# Patient Record
Sex: Male | Born: 1950 | Race: Black or African American | Hispanic: No | Marital: Single | Smoking: Former smoker
Health system: Southern US, Community
[De-identification: ages and names within clinical notes are randomized; demographics above are authoritative.]

## PROBLEM LIST (undated history)

## (undated) DIAGNOSIS — I509 Heart failure, unspecified: Secondary | ICD-10-CM

## (undated) DIAGNOSIS — M109 Gout, unspecified: Secondary | ICD-10-CM

## (undated) DIAGNOSIS — I10 Essential (primary) hypertension: Secondary | ICD-10-CM

## (undated) DIAGNOSIS — I499 Cardiac arrhythmia, unspecified: Secondary | ICD-10-CM

## (undated) DIAGNOSIS — K5792 Diverticulitis of intestine, part unspecified, without perforation or abscess without bleeding: Secondary | ICD-10-CM

## (undated) DIAGNOSIS — J449 Chronic obstructive pulmonary disease, unspecified: Secondary | ICD-10-CM

## (undated) HISTORY — DX: Essential (primary) hypertension: I10

## (undated) HISTORY — PX: CARDIAC SURGERY: SHX584

## (undated) HISTORY — PX: CARDIAC CATHETERIZATION: SHX172

## (undated) HISTORY — DX: Cardiac arrhythmia, unspecified: I49.9

---

## 2003-05-16 ENCOUNTER — Other Ambulatory Visit: Payer: Self-pay

## 2003-06-16 ENCOUNTER — Other Ambulatory Visit: Payer: Self-pay

## 2003-07-28 ENCOUNTER — Other Ambulatory Visit: Payer: Self-pay

## 2003-11-27 ENCOUNTER — Other Ambulatory Visit: Payer: Self-pay

## 2004-07-11 ENCOUNTER — Emergency Department: Payer: Self-pay | Admitting: Emergency Medicine

## 2004-07-24 ENCOUNTER — Observation Stay: Payer: Self-pay | Admitting: Internal Medicine

## 2004-08-10 ENCOUNTER — Inpatient Hospital Stay: Payer: Self-pay | Admitting: Internal Medicine

## 2005-01-10 ENCOUNTER — Emergency Department: Payer: Self-pay | Admitting: Emergency Medicine

## 2005-01-10 ENCOUNTER — Other Ambulatory Visit: Payer: Self-pay

## 2005-02-08 ENCOUNTER — Emergency Department: Payer: Self-pay | Admitting: Emergency Medicine

## 2005-04-02 ENCOUNTER — Other Ambulatory Visit: Payer: Self-pay

## 2005-04-02 ENCOUNTER — Emergency Department: Payer: Self-pay | Admitting: Emergency Medicine

## 2005-04-18 ENCOUNTER — Other Ambulatory Visit: Payer: Self-pay

## 2005-04-18 ENCOUNTER — Inpatient Hospital Stay: Payer: Self-pay

## 2005-05-21 ENCOUNTER — Other Ambulatory Visit: Payer: Self-pay

## 2005-05-21 ENCOUNTER — Inpatient Hospital Stay: Payer: Self-pay | Admitting: Internal Medicine

## 2005-06-04 ENCOUNTER — Other Ambulatory Visit: Payer: Self-pay

## 2005-06-04 ENCOUNTER — Emergency Department: Payer: Self-pay | Admitting: Unknown Physician Specialty

## 2005-06-12 ENCOUNTER — Other Ambulatory Visit: Payer: Self-pay

## 2005-06-12 ENCOUNTER — Inpatient Hospital Stay: Payer: Self-pay

## 2005-10-24 ENCOUNTER — Emergency Department: Payer: Self-pay | Admitting: Emergency Medicine

## 2005-10-24 ENCOUNTER — Other Ambulatory Visit: Payer: Self-pay

## 2006-12-17 ENCOUNTER — Emergency Department: Payer: Self-pay | Admitting: Emergency Medicine

## 2011-03-20 ENCOUNTER — Inpatient Hospital Stay: Payer: Self-pay | Admitting: Internal Medicine

## 2012-08-04 ENCOUNTER — Emergency Department: Payer: Self-pay | Admitting: Unknown Physician Specialty

## 2012-08-31 ENCOUNTER — Emergency Department: Payer: Self-pay | Admitting: Emergency Medicine

## 2012-09-24 ENCOUNTER — Emergency Department: Payer: Self-pay | Admitting: Emergency Medicine

## 2013-09-06 ENCOUNTER — Encounter: Payer: Self-pay | Admitting: Podiatry

## 2013-09-06 ENCOUNTER — Ambulatory Visit (INDEPENDENT_AMBULATORY_CARE_PROVIDER_SITE_OTHER): Payer: Medicare Other | Admitting: Podiatry

## 2013-09-06 VITALS — BP 126/86 | HR 78 | Resp 22 | Ht 71.0 in | Wt 205.0 lb

## 2013-09-06 DIAGNOSIS — M79609 Pain in unspecified limb: Secondary | ICD-10-CM

## 2013-09-06 DIAGNOSIS — B351 Tinea unguium: Secondary | ICD-10-CM

## 2013-09-06 NOTE — Progress Notes (Signed)
Subjective:     Patient ID: George Hopkins, male   DOB: Oct 27, 1950, 63 y.o.   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

## 2013-12-06 ENCOUNTER — Ambulatory Visit: Payer: Medicare Other | Admitting: Podiatry

## 2013-12-16 ENCOUNTER — Ambulatory Visit (INDEPENDENT_AMBULATORY_CARE_PROVIDER_SITE_OTHER): Payer: Medicare Other | Admitting: Podiatry

## 2013-12-16 DIAGNOSIS — B351 Tinea unguium: Secondary | ICD-10-CM

## 2013-12-16 DIAGNOSIS — M79676 Pain in unspecified toe(s): Secondary | ICD-10-CM

## 2013-12-16 DIAGNOSIS — M79609 Pain in unspecified limb: Secondary | ICD-10-CM

## 2013-12-16 NOTE — Progress Notes (Signed)
Subjective:     Patient ID: George Hopkins, male   DOB: 1951-01-28, 63 y.o.   MRN: [id number]  HPI patient presents with thick nailbeds 1-5 both feet that can become painful   Review of Systems     Objective:   Physical Exam Neurovascular status intact with thick yellow brittle nailbeds 1-5 both feet    Assessment:     Mycotic nail infection is with pain 1-5 both feet    Plan:     Debride painful nailbeds 1-5 both feet with no iatrogenic bleeding noted

## 2014-03-10 ENCOUNTER — Other Ambulatory Visit: Payer: Medicare Other

## 2014-03-20 ENCOUNTER — Emergency Department: Payer: Self-pay | Admitting: Emergency Medicine

## 2014-03-24 ENCOUNTER — Ambulatory Visit (INDEPENDENT_AMBULATORY_CARE_PROVIDER_SITE_OTHER): Payer: Medicare Other | Admitting: Podiatry

## 2014-03-24 DIAGNOSIS — M79676 Pain in unspecified toe(s): Secondary | ICD-10-CM

## 2014-03-24 DIAGNOSIS — M779 Enthesopathy, unspecified: Secondary | ICD-10-CM

## 2014-03-24 DIAGNOSIS — B351 Tinea unguium: Secondary | ICD-10-CM

## 2014-03-24 MED ORDER — TRIAMCINOLONE ACETONIDE 10 MG/ML IJ SUSP
10.0000 mg | Freq: Once | INTRAMUSCULAR | Status: AC
  Administered 2014-03-24: 10 mg

## 2014-03-24 NOTE — Progress Notes (Signed)
Subjective:     Patient ID: York RamFrancis Olesky, male   DOB: 03-31-1951, 63 y.o.   MRN: [id number]  HPI patient presents with thick nailbeds 1-5 both feet that can become painful. Also complains of a lot of pain on top of her left foot that's making it hard for her to ambulate   Review of Systems     Objective:   Physical Exam Neurovascular status intact with thick yellow brittle nailbeds 1-5 both feet. Noted to have inflammation and tendon irritation dorsum left foot upon palpation along with inflammatory capsulitis of the third metatarsal phalangeal joint    Assessment:     Mycotic nail infection is with pain 1-5 both feet. Tendinitis capsulitis like dorsal condition    Plan:     Debride painful nailbeds 1-5 both feet with no iatrogenic bleeding noted. Injected the dorsal capsule and tendon complex 3 mg Kenalog 5 mg Xylocaine and reappoint as needed for this particular problem

## 2014-05-17 ENCOUNTER — Inpatient Hospital Stay: Payer: Self-pay | Admitting: Internal Medicine

## 2014-05-17 LAB — CBC WITH DIFFERENTIAL/PLATELET
BASOS PCT: 0.4 %
Basophil #: 0 10*3/uL (ref 0.0–0.1)
EOS PCT: 1.2 %
Eosinophil #: 0.1 10*3/uL (ref 0.0–0.7)
HCT: 45.1 % (ref 40.0–52.0)
HGB: 14.7 g/dL (ref 13.0–18.0)
LYMPHS ABS: 1.9 10*3/uL (ref 1.0–3.6)
Lymphocyte %: 18.9 %
MCH: 30.6 pg (ref 26.0–34.0)
MCHC: 32.5 g/dL (ref 32.0–36.0)
MCV: 94 fL (ref 80–100)
MONO ABS: 0.9 x10 3/mm (ref 0.2–1.0)
Monocyte %: 8.9 %
Neutrophil #: 7.2 10*3/uL — ABNORMAL HIGH (ref 1.4–6.5)
Neutrophil %: 70.6 %
Platelet: 306 10*3/uL (ref 150–440)
RBC: 4.79 10*6/uL (ref 4.40–5.90)
RDW: 14.6 % — ABNORMAL HIGH (ref 11.5–14.5)
WBC: 10.2 10*3/uL (ref 3.8–10.6)

## 2014-05-17 LAB — URINALYSIS, COMPLETE
BILIRUBIN, UR: NEGATIVE
BLOOD: NEGATIVE
Glucose,UR: NEGATIVE mg/dL (ref 0–75)
Ketone: NEGATIVE
Nitrite: NEGATIVE
Ph: 7 (ref 4.5–8.0)
Protein: NEGATIVE
Specific Gravity: 1.006 (ref 1.003–1.030)
WBC UR: 27 /HPF (ref 0–5)

## 2014-05-17 LAB — COMPREHENSIVE METABOLIC PANEL
ALBUMIN: 3.2 g/dL — AB (ref 3.4–5.0)
ANION GAP: 12 (ref 7–16)
AST: 23 U/L (ref 15–37)
Alkaline Phosphatase: 61 U/L (ref 46–116)
BUN: 22 mg/dL — ABNORMAL HIGH (ref 7–18)
Bilirubin,Total: 0.6 mg/dL (ref 0.2–1.0)
Calcium, Total: 8.5 mg/dL (ref 8.5–10.1)
Chloride: 106 mmol/L (ref 98–107)
Co2: 27 mmol/L (ref 21–32)
Creatinine: 1.58 mg/dL — ABNORMAL HIGH (ref 0.60–1.30)
EGFR (African American): 57 — ABNORMAL LOW
EGFR (Non-African Amer.): 47 — ABNORMAL LOW
GLUCOSE: 117 mg/dL — AB (ref 65–99)
OSMOLALITY: 293 (ref 275–301)
POTASSIUM: 3 mmol/L — AB (ref 3.5–5.1)
SGPT (ALT): 18 U/L (ref 14–63)
SODIUM: 145 mmol/L (ref 136–145)
TOTAL PROTEIN: 7 g/dL (ref 6.4–8.2)

## 2014-05-17 LAB — MAGNESIUM: MAGNESIUM: 1.9 mg/dL

## 2014-05-17 LAB — TROPONIN I
TROPONIN-I: 0.04 ng/mL
Troponin-I: 0.05 ng/mL
Troponin-I: 0.06 ng/mL — ABNORMAL HIGH

## 2014-05-17 LAB — CK TOTAL AND CKMB (NOT AT ARMC)
CK, TOTAL: 103 U/L (ref 39–308)
CK-MB: 2.3 ng/mL (ref 0.5–3.6)

## 2014-05-17 LAB — PRO B NATRIURETIC PEPTIDE: B-TYPE NATIURETIC PEPTID: 3482 pg/mL — AB (ref 0–125)

## 2014-05-17 LAB — PROTIME-INR
INR: 1.1
PROTHROMBIN TIME: 14 s (ref 11.5–14.7)

## 2014-05-18 LAB — CBC WITH DIFFERENTIAL/PLATELET
BASOS ABS: 0.1 10*3/uL (ref 0.0–0.1)
BASOS PCT: 0.8 %
EOS ABS: 0.2 10*3/uL (ref 0.0–0.7)
EOS PCT: 2.2 %
HCT: 46 % (ref 40.0–52.0)
HGB: 15 g/dL (ref 13.0–18.0)
LYMPHS PCT: 25.2 %
Lymphocyte #: 1.8 10*3/uL (ref 1.0–3.6)
MCH: 31 pg (ref 26.0–34.0)
MCHC: 32.6 g/dL (ref 32.0–36.0)
MCV: 95 fL (ref 80–100)
Monocyte #: 0.9 x10 3/mm (ref 0.2–1.0)
Monocyte %: 12.1 %
NEUTROS PCT: 59.7 %
Neutrophil #: 4.3 10*3/uL (ref 1.4–6.5)
PLATELETS: 298 10*3/uL (ref 150–440)
RBC: 4.84 10*6/uL (ref 4.40–5.90)
RDW: 15.1 % — ABNORMAL HIGH (ref 11.5–14.5)
WBC: 7.3 10*3/uL (ref 3.8–10.6)

## 2014-05-18 LAB — BASIC METABOLIC PANEL
Anion Gap: 6 — ABNORMAL LOW (ref 7–16)
BUN: 26 mg/dL — ABNORMAL HIGH (ref 7–18)
CO2: 31 mmol/L (ref 21–32)
Calcium, Total: 8.4 mg/dL — ABNORMAL LOW (ref 8.5–10.1)
Chloride: 107 mmol/L (ref 98–107)
Creatinine: 1.69 mg/dL — ABNORMAL HIGH (ref 0.60–1.30)
EGFR (African American): 53 — ABNORMAL LOW
EGFR (Non-African Amer.): 43 — ABNORMAL LOW
Glucose: 104 mg/dL — ABNORMAL HIGH (ref 65–99)
OSMOLALITY: 292 (ref 275–301)
POTASSIUM: 3.7 mmol/L (ref 3.5–5.1)
SODIUM: 144 mmol/L (ref 136–145)

## 2014-05-18 LAB — LIPID PANEL
Cholesterol: 161 mg/dL (ref 0–200)
HDL: 40 mg/dL (ref 40–60)
Ldl Cholesterol, Calc: 99 mg/dL (ref 0–100)
TRIGLYCERIDES: 112 mg/dL (ref 0–200)
VLDL CHOLESTEROL, CALC: 22 mg/dL (ref 5–40)

## 2014-05-19 LAB — BASIC METABOLIC PANEL
Anion Gap: 5 — ABNORMAL LOW (ref 7–16)
BUN: 29 mg/dL — ABNORMAL HIGH (ref 7–18)
CALCIUM: 8.5 mg/dL (ref 8.5–10.1)
Chloride: 108 mmol/L — ABNORMAL HIGH (ref 98–107)
Co2: 28 mmol/L (ref 21–32)
Creatinine: 1.69 mg/dL — ABNORMAL HIGH (ref 0.60–1.30)
EGFR (African American): 53 — ABNORMAL LOW
EGFR (Non-African Amer.): 43 — ABNORMAL LOW
Glucose: 108 mg/dL — ABNORMAL HIGH (ref 65–99)
OSMOLALITY: 288 (ref 275–301)
Potassium: 3.9 mmol/L (ref 3.5–5.1)
Sodium: 141 mmol/L (ref 136–145)

## 2014-05-19 LAB — URINE CULTURE

## 2014-06-09 ENCOUNTER — Emergency Department: Payer: Self-pay | Admitting: Emergency Medicine

## 2014-06-23 ENCOUNTER — Other Ambulatory Visit: Payer: Medicare Other

## 2014-06-29 ENCOUNTER — Ambulatory Visit: Payer: Medicare Other | Admitting: Podiatry

## 2014-07-31 ENCOUNTER — Inpatient Hospital Stay
Admit: 2014-07-31 | Disposition: A | Discharge status: Home / Self Care | Payer: Self-pay | Attending: Internal Medicine | Admitting: Internal Medicine

## 2014-07-31 LAB — CBC
HCT: 44.2 % (ref 40.0–52.0)
HGB: 13.9 g/dL (ref 13.0–18.0)
MCH: 30 pg (ref 26.0–34.0)
MCHC: 31.3 g/dL — AB (ref 32.0–36.0)
MCV: 96 fL (ref 80–100)
PLATELETS: 215 10*3/uL (ref 150–440)
RBC: 4.62 10*6/uL (ref 4.40–5.90)
RDW: 15.8 % — AB (ref 11.5–14.5)
WBC: 7.4 10*3/uL (ref 3.8–10.6)

## 2014-07-31 LAB — COMPREHENSIVE METABOLIC PANEL
AST: 23 U/L
Albumin: 3.5 g/dL
Alkaline Phosphatase: 61 U/L
Anion Gap: 9 (ref 7–16)
BUN: 37 mg/dL — ABNORMAL HIGH
Bilirubin,Total: 0.9 mg/dL
CALCIUM: 8.7 mg/dL — AB
CHLORIDE: 104 mmol/L
Co2: 29 mmol/L
Creatinine: 1.92 mg/dL — ABNORMAL HIGH
GFR CALC AF AMER: 41 — AB
GFR CALC NON AF AMER: 35 — AB
GLUCOSE: 99 mg/dL
POTASSIUM: 3.5 mmol/L
SGPT (ALT): 22 U/L
Sodium: 142 mmol/L
Total Protein: 6.7 g/dL

## 2014-07-31 LAB — PROTIME-INR
INR: 1.1
Prothrombin Time: 14.5 secs

## 2014-07-31 LAB — CK TOTAL AND CKMB (NOT AT ARMC)
CK, TOTAL: 103 U/L
CK-MB: 5.3 ng/mL — ABNORMAL HIGH

## 2014-07-31 LAB — PRO B NATRIURETIC PEPTIDE: B-Type Natriuretic Peptide: 527 pg/mL — ABNORMAL HIGH

## 2014-07-31 LAB — TROPONIN I: Troponin-I: 0.16 ng/mL — ABNORMAL HIGH

## 2014-08-01 LAB — CBC WITH DIFFERENTIAL/PLATELET
Basophil #: 0.1 10*3/uL (ref 0.0–0.1)
Basophil %: 0.9 %
EOS PCT: 1.6 %
Eosinophil #: 0.1 10*3/uL (ref 0.0–0.7)
HCT: 44.5 % (ref 40.0–52.0)
HGB: 14 g/dL (ref 13.0–18.0)
LYMPHS PCT: 27 %
Lymphocyte #: 2.1 10*3/uL (ref 1.0–3.6)
MCH: 30.3 pg (ref 26.0–34.0)
MCHC: 31.5 g/dL — ABNORMAL LOW (ref 32.0–36.0)
MCV: 96 fL (ref 80–100)
MONOS PCT: 13.8 %
Monocyte #: 1.1 x10 3/mm — ABNORMAL HIGH (ref 0.2–1.0)
NEUTROS PCT: 56.7 %
Neutrophil #: 4.4 10*3/uL (ref 1.4–6.5)
PLATELETS: 224 10*3/uL (ref 150–440)
RBC: 4.62 10*6/uL (ref 4.40–5.90)
RDW: 16.1 % — ABNORMAL HIGH (ref 11.5–14.5)
WBC: 7.7 10*3/uL (ref 3.8–10.6)

## 2014-08-01 LAB — COMPREHENSIVE METABOLIC PANEL
ALBUMIN: 3.7 g/dL
ALK PHOS: 59 U/L
ALT: 21 U/L
Anion Gap: 8 (ref 7–16)
BUN: 37 mg/dL — ABNORMAL HIGH
Bilirubin,Total: 0.7 mg/dL
CALCIUM: 8.6 mg/dL — AB
CO2: 30 mmol/L
CREATININE: 1.96 mg/dL — AB
Chloride: 105 mmol/L
EGFR (African American): 40 — ABNORMAL LOW
EGFR (Non-African Amer.): 35 — ABNORMAL LOW
Glucose: 111 mg/dL — ABNORMAL HIGH
Potassium: 3.7 mmol/L
SGOT(AST): 20 U/L
Sodium: 143 mmol/L
Total Protein: 6.8 g/dL

## 2014-08-01 LAB — TROPONIN I
TROPONIN-I: 0.13 ng/mL — AB
Troponin-I: 0.14 ng/mL — ABNORMAL HIGH

## 2014-08-01 LAB — CK-MB
CK-MB: 4.7 ng/mL
CK-MB: 6 ng/mL — ABNORMAL HIGH

## 2014-08-01 LAB — DIGOXIN LEVEL: Digoxin: 1.6 ng/mL

## 2014-08-02 LAB — BASIC METABOLIC PANEL
ANION GAP: 7 (ref 7–16)
BUN: 43 mg/dL — ABNORMAL HIGH
CALCIUM: 8.3 mg/dL — AB
CO2: 28 mmol/L
Chloride: 105 mmol/L
Creatinine: 1.99 mg/dL — ABNORMAL HIGH
EGFR (African American): 39 — ABNORMAL LOW
GFR CALC NON AF AMER: 34 — AB
Glucose: 170 mg/dL — ABNORMAL HIGH
Potassium: 3.8 mmol/L
SODIUM: 140 mmol/L

## 2014-08-03 LAB — BASIC METABOLIC PANEL
Anion Gap: 9 (ref 7–16)
BUN: 48 mg/dL — ABNORMAL HIGH
CALCIUM: 8.4 mg/dL — AB
Chloride: 103 mmol/L
Co2: 29 mmol/L
Creatinine: 2.03 mg/dL — ABNORMAL HIGH
EGFR (African American): 38 — ABNORMAL LOW
EGFR (Non-African Amer.): 33 — ABNORMAL LOW
GLUCOSE: 164 mg/dL — AB
POTASSIUM: 3.9 mmol/L
SODIUM: 141 mmol/L

## 2014-08-20 NOTE — H&P (Signed)
PATIENT NAME:  George Hopkins, George Hopkins#:  [id number] DATE OF BIRTH:  Sep 30, 1950  PRIMARY CARE PHYSICIAN:  Serita Shellerrnest B. Maryellen PileEason, MD  CARDIOLOGIST:  Bobbie Stackwayne D. Callwood, MD   CHIEF COMPLAINT: "I built up fluid."  HISTORY OF PRESENT ILLNESS: This is a 64 year old man who thinks he built up fluid. He states over the past 2 days he cannot breathe very well. He cannot take a deep breath. He has developed pressure in the chest, constant for 2 days, 8/10 in intensity in the center of his chest. No radiation. Nothing makes it better or worse, even sometimes he feels like food gets stuck in his esophagus. For the last 2 nights he has been unable to lie flat. In the ER, he had a lot of  PVCs on his EKG.  His potassium was 3. His BNP was elevated at 3482 and his chest x-ray showed congestive heart failure. Hospitalist services were contacted for further evaluation.   PAST MEDICAL HISTORY: Congestive heart failure, irregular heartbeat, diverticulitis, hypertension, gout, chronic respiratory failure, on oxygen secondary to chronic obstructive pulmonary disease, hyperlipidemia.   PAST SURGICAL HISTORY: None.   ALLERGIES: PENICILLIN.   SOCIAL HISTORY: No smoking. No alcohol. No drug use. Lives alone. Was a Medical illustratorsalesman, Psychiatric nursefactory worker, and Scientist, product/process developmenttextile worker in the past.   FAMILY HISTORY: Father died. HE had alcohol abuse and aphasia. Mother died of Alzheimer's.   MEDICATIONS: As per prescription writer include amlodipine-benazepril 5-20 one capsule daily for high blood pressure, Colcrys 0.6 mg once a day, Combivent 2 puffs 4 times a day, Coreg 3.125 mg twice a day, furosemide 80 mg 3 times a day, meloxicam 15 mg daily, Pravachol 20 mg daily.  REVIEW OF SYSTEMS:  CONSTITUTIONAL: Positive for fever. Positive for chills. Positive for sweats. He states his weight has been stable. Positive for fatigue.  EYES: He wears glasses.  EARS, NOSE, MOUTH, AND THROAT: No hearing loss. Positive for food being stuck when he swallows it. No  sore throat.  CARDIOVASCULAR: Positive for chest pain, constant.  RESPIRATORY: Positive for shortness of breath. Positive for wheeze. No cough. No sputum. No hemoptysis.  GASTROINTESTINAL: Positive for nausea, but no vomiting. No abdominal pain. Positive for constipation. No bright red blood per rectum. No melena.  GENITOURINARY: No burning on urination. No hematuria.  MUSCULOSKELETAL: No joint pain or muscle pain.  INTEGUMENT: No rashes or eruptions.  NEUROLOGIC: No fainting or blackouts.  PSYCHIATRIC: Positive for depression.  ENDOCRINE: No thyroid problems.  HEMATOLOGIC AND LYMPHATIC: No anemia, no easy bruising or bleeding.   PHYSICAL EXAMINATION: VITAL SIGNS: On presentation to the ER included a blood pressure of 105/67, pulse oximetry 95% on 4 liters. They did check him on room air, which was 84%, pulse was 119, respirations 27, temperature 97.8.  GENERAL: No respiratory distress, sitting upright in bed.  EYES: Conjunctivae and lids normal. Pupils equal, round, and reactive to light. Extraocular muscles intact. No nystagmus.  EARS, NOSE, MOUTH, AND THROAT: Tympanic membranes, no erythema. Nasal mucosa, no erythema. Throat, no erythema, no exudate seen.  Lips and gums, no lesions.  NECK: Positive for JVD. No bruits. No lymphadenopathy. No thyromegaly. No thyroid nodules palpated.  RESPIRATORY: Decreased breath sounds bilaterally. Positive rales in the bases. No use of accessory muscles to breathe. No rhonchi or wheeze heard.  CARDIOVASCULAR: S1, S2, soft, no gallops, rubs, or murmurs heard. Carotid upstroke 2+ bilaterally and no bruits.  Dorsalis pedis pulses 1+ bilaterally, 3+ edema bilateral lower extremity.  ABDOMEN: Soft, nontender. No organomegaly  or splenomegaly. Normoactive bowel sounds. No masses felt.  LYMPHATIC: No lymph nodes in the neck.  MUSCULOSKELETAL: Edema 3+. No clubbing. No cyanosis.  SKIN: No rashes or ulcers seen.   NEUROLOGIC: Cranial nerves II-XII grossly intact.  Deep tendon reflexes 2+ bilateral lower extremities.  PSYCHIATRIC: The patient is oriented to person, place, and time.   LABORATORY AND RADIOLOGICAL DATA: EKG showed sinus tachycardia, premature ventricular complexes, frequent, nonspecific intraventricular conduction block. Magnesium 1.9. Troponin 0.05, INR 1.1. Glucose 117, BUN 22, creatinine 1.58, sodium 145, potassium 3.0, chloride 106, CO2 of 27, calcium 8.5. Liver function tests normal range. White blood cell count 10.2, H and H 14.7 and 45.1, platelet count of 306,000. BNP 3482. Chest x-ray consistent with congestive heart failure. Urinalysis, 2+ leukocyte esterase, 1+ bacteria.   ASSESSMENT AND PLAN: 1.  Acute congestive heart failure. I will get an echocardiogram to determine what type of heart failure he has. I will increase his Coreg dose to 6.25 mg b.i.d., continue the benazepril part of the Lotrel. I will get rid of the Norvasc since that can cause swelling.  I will give Lasix 80 mg b.i.d.  2.  Chest pain, likely from the congestive heart failure. I will continue Coreg, aspirin, get serial cardiac enzymes. Monitor on telemetry, get a cardiology consultation.  3.  Chronic hypoxic respiratory failure, likely a combination of chronic obstructive pulmonary disease and congestive heart failure. Continue oxygen supplementation. There is no need for steroids at this point since the patient is breathing better after intravenous Lasix given in the Emergency Room.  4.  Hypertension. Blood pressure a little borderline. Continue to monitor on benazepril and increased dose of Coreg.  5.  Positive urinalysis. Will send off a urine culture. Start p.o. Cipro.  6.  Gout history. Continue Colcrys.   7.  Hypokalemia. Will replace potassium stat at 40 mEq b.i.d. while on intravenous Lasix; this could be the reason why he is having premature ventricular contractions. 8.  Hyperlipidemia. Continue Pravachol, check a lipid profile in the a.m.   TIME SPENT ON  ADMISSION: 55 minutes.   CODE STATUS: The patient is a full code.    ____________________________ Herschell Dimes. Renae Gloss, MD rjw:LT D: 05/17/2014 17:19:52 ET T: 05/17/2014 17:46:37 ET JOB#: [id number]  cc: Herschell Dimes. Renae Gloss, MD, <Dictator> Serita Sheller. Maryellen Pile, MD Dwayne D. Juliann Pares, MD  Salley Scarlet MD ELECTRONICALLY SIGNED 05/19/2014 14:44

## 2014-08-20 NOTE — Consult Note (Signed)
PATIENT NAME:  George Hopkins, George Hopkins MR#:  [id number] DATE OF BIRTH:  10-24-1950  DATE OF CONSULTATION:  05/17/2014  REFERRING PHYSICIAN:  Herschell Dimes. Renae Gloss, MD  CONSULTING PHYSICIAN:  Lamar Blinks, MD  REASON FOR CONSULTATION: Acute on chronic systolic dysfunction congestive heart failure, elevated troponin, chronic obstructive pulmonary disease, and hypertension.   CHIEF COMPLAINT: "I got short of breath."   HISTORY OF PRESENT ILLNESS: This is a 64 year old male with known chronic systolic dysfunction congestive heart failure in the past on appropriate medication management and chronic COPD with oxygen requirements, who has had some cough and congestion, and significant PND and orthopnea over the last several days. He has had continued use of Lasix, but this did not help. He has not had any apparent change in dietary habits or dietary indiscretions, although he does have chronic kidney disease stage III, which may be slightly worse. He did arrive with an EKG showing sinus tachycardia with pre-ventricular contractions, left axis deviation, left bundle branch block, unchanged from before. In addition, he is on appropriate medication for essential hypertension. BNP was 3482 with an elevated troponin of 0.06, most consistent with demand ischemia. Currently, he is feeling slightly better with intravenous Lasix and oxygen.   REVIEW OF SYSTEMS: The remainder of review of systems is negative for vision change, ringing in the ears, hearing loss, heartburn, nausea, vomiting, diarrhea, bloody stools, stomach pain, extremity pain, leg weakness, cramping of the buttocks, known blood clots, headaches, blackouts, dizzy spells, nosebleeds frequent urination, urination at night, muscle weakness, numbness, anxiety, depression, skin lesions, skin rashes.   PAST MEDICAL HISTORY: Chronic obstructive pulmonary disease, oxygen requiring, essential hypertension, abnormal EKG, and heart failure.   FAMILY HISTORY: The  patient's family members with known hypertension and diabetes, but no evidence of early cardiovascular disease.   SOCIAL HISTORY: Currently denies alcohol or tobacco use.   ALLERGIES: As listed.   MEDICATIONS: As listed.   PHYSICAL EXAMINATION: VITAL SIGNS: Pressure is 122/68 bilaterally, heart rate is 90, upright and reclining, and slightly irregular.  GENERAL: He is a well-appearing large male in no acute distress.  HEENT: No icterus, thyromegaly, ulcers, hemorrhage, or xanthelasma.  CARDIOVASCULAR: Irregularly irregular with normal S1, soft S2, with distant heart sounds. PMI diffuse and no apparent significant murmurs. Carotid upstroke normal without bruit. Jugular venous pressure is normal, able to be heard or seen.  LUNGS: Have bibasilar crackles with decreased breath sounds in bases.  ABDOMEN: Soft, nontender, cannot assess hepatosplenomegaly or masses due to increased abdominal girth.  EXTREMITIES: 2+ radial, trace femoral, no dorsal pedal pulses, with trace to 1+ lower extremity edema.  NEUROLOGICAL: He is oriented to time, place, and person, with normal mood and affect.   ASSESSMENT: A 64 year old male with acute on chronic systolic dysfunction congestive heart failure, essential hypertension, mixed hyperlipidemia, severe chronic obstructive pulmonary disease, needing further treatment options.   RECOMMENDATIONS: 1. Intravenous Lasix for pulmonary edema, lower extremity edema.  2. Continue oxygen for pulmonary disease and COPD.  3. Essential hypertension with ACE inhibitor, watching closely for worsening concerns of chronic kidney disease.  4. No further intervention of elevated troponin, most consistent with demand ischemia and no current evidence of myocardial infarction.  5. Possible echocardiogram for LV systolic dysfunction and adjustments of medications, but I would consider continuation of beta blocker, ACE inhibitor if able, and diuretics      ____________________________ Lamar Blinks, MD bjk:mw D: 05/17/2014 13:50:17 ET T: 05/17/2014 14:18:53 ET JOB#: [id number]  cc: Lamar Blinks,  MD, <Dictator> Lamar BlinksBRUCE J Kayon Dozier MD ELECTRONICALLY SIGNED 05/19/2014 8:37

## 2014-08-20 NOTE — Consult Note (Signed)
PATIENT NAME:  George Hopkins, George Hopkins MR#:  [id number] DATE OF BIRTH:  02/07/1951  DATE OF CONSULTATION:  08/01/2014  REFERRING PHYSICIAN:   CONSULTING PHYSICIAN:  Marcina MillardAlexander Liridona Mashaw, MD  PRIMARY CARE PHYSICIAN:  Serita Shellerrnest B. Maryellen PileEason, MD CARDIOLOGIST:  Bobbie Stackwayne D. Callwood, MD  CHIEF COMPLAINT:  Shortness of breath.   HISTORY OF PRESENT ILLNESS:  The patient is a 64 year old gentleman with known history of cardiomyopathy and history of systolic congestive heart failure, who presented with chief complaint of shortness of breath.  The patient reports a 1-2 day history of worsening pedal edema with associated exertional dyspnea and orthopnea.  [facility] where EKG was nondiagnostic.  Admission labs were notable for borderline elevated troponin of 0.16, followup troponin was 0.13 in the absence of chest pain.  EKG revealed sinus rhythm with left bundle branch block.  The patient was treated with intravenous furosemide with diuresis and clinical improvement.   PAST MEDICAL HISTORY:  1.  Known cardiomyopathy with LVEF less than 20%.  2.  Chronic systolic congestive heart failure.  3.  Essential hypertension.  4.  COPD on chronic O2 therapy.  5.  Hyperlipidemia.   MEDICATIONS ON ADMISSION:  Lisinopril 20 mg daily, digoxin 0.125 mg daily, Pravachol 20 mg daily, amlodipine/benazepril 5/20 daily, carvedilol 6.25 mg b.i.d., furosemide 100 mg daily, meloxicam 15 mg daily, Colcrys 0.6 mg daily, Combivent inhaler 2 puffs q.i.d.   SOCIAL HISTORY:  The patient denies tobacco abuse.   FAMILY HISTORY:  No immediate family history of coronary artery disease or myocardial infarction.   REVIEW OF SYSTEMS:   CONSTITUTIONAL:  No fever or chills.   EYES:  No blurry vision.  EARS: No hearing loss.  RESPIRATORY: The patient has chronic exertional dyspnea due to underlying COPD.    CARDIOVASCULAR:  No chest pain.  GASTROINTESTINAL:  No nausea, vomiting, or diarrhea.  GENITOURINARY:  No dysuria  or hematuria.  ENDOCRINE: No polyuria or polydipsia.  MUSCULOSKELETAL: No arthralgias or myalgias.  NEUROLOGICAL:  No focal muscle weakness or numbness.  PSYCHOLOGICAL:  No depression or anxiety.   PHYSICAL EXAMINATION:  VITAL SIGNS:  Blood pressure 100/54, pulse 113, respirations 19, temperature 97.6, pulse oximetry 98%.  HEENT:  Pupils equal, reactive to light and accommodation.  NECK:  Supple without thyromegaly.  LUNGS:  Clear.  HEART:  Normal JVP.  Normal PMI.  Regular rate and rhythm.  Normal S1 and S2.  No appreciable gallop, murmur, or rub.  ABDOMEN:  Soft and nontender.   EXTREMITIES:  Pulses were intact bilaterally.  MUSCULOSKELETAL: Normal muscle tone.  NEUROLOGIC:  The patient is alert and oriented x 3.  Motor and sensory both grossly intact.   IMPRESSION: A 64 year old gentleman with known history of dilated cardiomyopathy, chronic systolic congestive heart failure, who presents with acute on chronic congestive heart failure with peripheral edema and shortness of breath.  The patient has shown initial signs of clinical improvement after diuresis.  The patient has borderline elevated troponin, likely due to demand supply ischemia and not due to acute coronary syndrome.   RECOMMENDATIONS:  1.  Agree with overall current therapy.  2.  Continue diuresis with intravenous furosemide.  3.  Continue to closely monitor daily weights and I's and O's.   4.  Repeat 2-D echocardiogram if not performed within the past 6 months.  5.  Further recommendations pending the patient's initial clinical course.     ____________________________ Marcina MillardAlexander Darolyn Double, MD ap:NT D: 08/01/2014 17:29:20 ET T: 08/01/2014 18:20:24 ET JOB#:  [id number]  cc: Marcina Millard, MD, <Dictator> Marcina Millard MD ELECTRONICALLY SIGNED 08/15/2014 17:17

## 2014-08-20 NOTE — Discharge Summary (Signed)
PATIENT NAME:  George Hopkins, George Hopkins MR#:  [id number] DATE OF BIRTH:  [birth year]  DATE OF ADMISSION:  07/31/2014 DATE OF DISCHARGE:  08/03/2014  PRESENTING COMPLAINT: Shortness of breath.  CARDIOLOGIST: Dr. Juliann Paresallwood   PRIMARY CARE PHYSICIAN: Dr. Maryellen PileEason.  CONSULTANTS: Cardiology - Dr. Darrold JunkerParaschos.   DISCHARGE DIAGNOSES: 1.  Acute on chronic congestive heart failure, systolic.  2.  Hypertension.  3.  Chronic kidney disease, stage III.  4.  Chronic home oxygen.   CODE STATUS: FULL.  DISCHARGE MEDICATIONS: 1.  Combivent 2 puffs 4 times a day as needed.  2.  Amlodipine/benazepril 5/20 one tablet p.o. daily.  3.  Colcrys 0.6 mg p.o. daily.  4.  Pravachol 20 mg daily.  5.  Carvedilol 6.25 b.i.d.  6.  Torsemide 100 mg p.o. daily.  7.  Meloxicam 15 mg daily.  8.  Digoxin 0.125 mg every other day.  9.  Prednisone taper.   NOTE: The patient is asked to stop taking lisinopril.   HOME OXYGEN: 3 liters nasal cannula, chronic.  DISCHARGE INSTRUCTIONS: 1.  Follow up with Dr. Judi CongLateef Singh, first available, for CKD stage III.  2.  Follow up with Dr. Juliann Paresallwood.  3.  Follow up with Dr. Maryellen PileEason in 1 week.  4.  Try to get metabolic panel on your visit with Dr. Maryellen PileEason.  5.  Heart failure clinic appointment with Clarisa Kindredina Hackney, nurse practitioner, on Aug 25, 2014 at 12:30 p.m.   DIAGNOSTIC DATA: Creatinine at discharge 2.03. Sodium 141, potassium 3.9. CBC within normal limits.   BRIEF SUMMARY OF HOSPITAL COURSE: [name] is a 64 year old African American gentleman with past medical history of systolic congestive heart failure, chronic, with EF of 20%, who presented with increasing shortness of breath and was admitted with:  1.  Acute on chronic systolic congestive heart failure. Received IV Lasix. Creatinine slowly trended up. Changed back to torsemide. He will follow up with Dr. Juliann Paresallwood as outpatient and watch salt in his diet along with CHF clinic appointment and followup metabolic panel with Dr.  Maryellen PileEason.  2.  Elevated troponin in the setting of congestive heart failure due to demand ischemia. Cardiology input noted. No further work-up.  3.  CKD, acute on chronic. CKD appears to be stable. Increased mild creatinine secondary to IV diuresis. The patient is set up to follow up with nephrology as outpatient.  4.  Hypertension. Continue blood pressure medications.  5.  COPD with mild exacerbation. Added Mucomyst with oral inhalers and prednisone taper.  6.  DVT prophylaxis with subcutaneous heparin t.i.d.   Overall hospital stay otherwise remained stable. The patient remained a FULL code.   TIME SPENT: 40 minutes.  ____________________________ Wylie HailSona A. Allena KatzPatel, MD sap:sb D: 08/04/2014 06:59:23 ET T: 08/04/2014 12:15:43 ET JOB#: [id number]  cc: Sydna Brodowski A. Allena KatzPatel, MD, <Dictator> Serita ShellerErnest B. Maryellen PileEason, MD Dwayne D. Juliann Paresallwood, MD Willow OraSONA A Micalah Cabezas MD ELECTRONICALLY SIGNED 08/09/2014 10:33

## 2014-08-20 NOTE — H&P (Signed)
PATIENT NAME:  George SmothersJEFFREYS, Steel N MR#:  [id number] DATE OF BIRTH:  [birth year]  DATE OF ADMISSION:  08/01/2014  REFERRING PHYSICIAN:  Cecille AmsterdamJonathan E. Mayford KnifeWilliams, MD   PRIMARY CARE PHYSICIAN:  Serita Shellerrnest B. Maryellen PileEason, MD   CARDIOLOGIST:  Bobbie Stackwayne D. Callwood, MD   CHIEF COMPLAINT:  Shortness of breath.    HISTORY OF PRESENT ILLNESS:  This 64 year old African-American gentleman with a past medical history of systolic congestive heart failure, EF less than 20%, presents with shortness of breath. He describes the shortness of breath mainly as dyspnea on exertion 2 days in total duration with worsening lower extremity edema, as well as orthopnea; however, denies any chest pain, fevers, chills, or cough. She presented to the hospital for further workup and evaluation.   REVIEW OF SYSTEMS: CONSTITUTIONAL:  Denies fever or chills. Positive for fatigue and weakness.  EYES:  Denies blurred vision, double vision, or eye pain.  EARS, NOSE, AND THROAT:  Denies tinnitus, ear pain, or hearing loss.  RESPIRATORY:  Denies cough or wheeze. Positive for shortness of breath.  CARDIOVASCULAR:  Denies chest pain or palpitations. Positive for orthopnea and edema as stated above.  GASTROINTESTINAL:  Denies nausea, vomiting, diarrhea, or abdominal pain.  GENITOURINARY:  Denies dysuria or hematuria.  ENDOCRINE:  Denies nocturia or thyroid problems.  HEMATOLOGIC AND LYMPHATIC:  Denies easy bruising or bleeding.  SKIN:  Denies rash or lesion.  MUSCULOSKELETAL:  Denies pain in neck, back, shoulders, knees, or hips or arthritic symptoms.  NEUROLOGIC:  Denies paralysis or paresthesias.  PSYCHIATRIC:  Denies anxiety or depressive symptoms.   Otherwise, a full review of systems performed by me is negative.   PAST MEDICAL HISTORY:  Includes congestive heart failure, systolic, with EF less than 20%; hypertension, essential; COPD with chronic respiratory failure on 3 liters via nasal cannula at baseline; gout; hyperlipidemia, unspecified.    SOCIAL HISTORY:  Denies any current alcohol, tobacco, or drug usage.   FAMILY HISTORY:  Denies any known cardiovascular or pulmonary disorders.   ALLERGIES:  No known drug allergies.   HOME MEDICATIONS:  Include meloxicam 15 mg p.o. daily, lisinopril 20 mg p.o. daily, digoxin 125 mcg p.o. daily, Colcrys 0.6 mg p.o. daily, Pravachol 20 mg p.o. daily, amlodipine/benazepril 5/20 mg p.o. daily, Coreg 6.25 mg p.o. b.i.d., Combivent 18-103 mcg inhalation 2 puffs 4 times daily, torsemide 100 mg p.o. daily, Lasix 80 mg p.o. b.i.d.   PHYSICAL EXAMINATION: VITAL SIGNS:  Temperature 98.2, heart rate 109, respirations 21, blood pressure 113/73, saturating 93% on 4 liters via nasal cannula. Weight 120.9 kg, BMI 37.2.  GENERAL:  Obese, African-American gentleman currently in no acute distress.  HEAD:  Normocephalic, atraumatic.  EYES:  Pupils are equal, round, and reactive to light. Extraocular muscles are intact. No scleral icterus.  MOUTH:  Moist mucosal membrane. Dentition is intact. No abscess noted.  EARS, NOSE, AND THROAT:  Clear with exudates. No external lesions.  NECK:  Supple. No thyromegaly. No nodules. no jvd, but JVD is somewhat limited by body habitus.  PULMONARY:  Diminished breath sounds throughout all lung fields with crackles at the right base. No use of accessory muscles. Good respiratory effort.  CHEST:  Nontender to palpation.  CARDIOVASCULAR:  S1 and S2, regular rate and rhythm with 2 to 3+ edema to the knees bilaterally. Pedal pulses are 2+ bilaterally.  GASTROINTESTINAL:  Soft, nontender, nondistended. No masses. Positive bowel sounds. No hepatosplenomegaly.  MUSCULOSKELETAL:  No swelling, clubbing, or edema other than stated above. Range of motion is full  in all extremities.  NEUROLOGIC:  Cranial nerves II through XII are intact. No gross focal neurological deficits. Sensation is intact. Reflexes are intact.  SKIN:  No ulcerations, lesions, rashes, or cyanosis. Skin is warm and  dry. Turgor is intact.   PSYCHIATRIC:  Mood and affect are within normal limits. The patient is awake, alert, and oriented x 3. Insight and judgment are intact.   LABORATORY DATA:  Sodium of 142, potassium 3.5, chloride 104, bicarbonate 29, BUN 37, creatinine 1.92, glucose 99. LFTs are within normal limits. Troponin is 0.16. WBC is 7.4, hemoglobin 13.9, and platelets are 215,000. Chest x-ray performed reveals cardiomegaly with bilateral interstitial opacities suggestive of interstitial edema.   ASSESSMENT AND PLAN:  A 64 year old African-American gentleman with history of systolic congestive heart failure, ejection fraction of 20%, presenting with shortness of breath.   1.  Acute-on-chronic systolic congestive heart failure. Place on telemetry. Trend cardiac enzymes x 3. Diurese with IV Lasix. Follow urine output, renal function, and daily weights. We will consult cardiology. He follows with Dr. Juliann Pares normally.  2.  Elevated troponin in the setting of congestive heart failure. Place on telemetry. Trend cardiac enzymes. If remarkably upward trend, place on heparin drip.  3.  Acute kidney injury. Slight upward trend from baseline. We will continue diuresis, as he is volume overloaded 4.  Hypertension, essential. Continue with his home medications.  5.  Chronic obstructive pulmonary disease with chronic respiratory failure. Continue with supplemental oxygen and DuoNeb treatments.  6.  Venous thromboembolism prophylaxis provided with heparin subcutaneously.   CODE STATUS:  The patient is a full code.   TIME SPENT:  45 minutes.    ____________________________ Cletis Athens. Nakaiya Beddow, MD dkh:nb D: 08/01/2014 02:38:43 ET T: 08/01/2014 04:29:35 ET JOB#: [id number]  cc: Cletis Athens. Sheretta Grumbine, MD, <Dictator> Sherley Mckenney Synetta Shadow MD ELECTRONICALLY SIGNED 08/02/2014 2:12

## 2014-08-20 NOTE — Discharge Summary (Signed)
PATIENT NAME:  George Hopkins, George Hopkins MR#:  [id number] DATE OF BIRTH:  23-Jun-1950  DATE OF ADMISSION:  05/17/2014 DATE OF DISCHARGE:  05/19/2014  ADMITTING DIAGNOSIS: Shortness of breath.   DISCHARGE DIAGNOSES:  1.  Acute on chronic respiratory failure due to acute on chronic congestive heart failure, systolic exacerbation. Chest pain, possibly due to demand ischemia. Medical treatment with outpatient follow with Dr. Juliann Paresallwood recommended. May need ischemic workup.  2.  Acute hypoxic respiratory failure due to acute systolic congestive heart failure.  3.  Hypertension.  4.  Abnormal urinalysis, but without any symptoms of urinary tract infection.  5.  Gout.  6.  Hypokalemia, status post replacement.  7.  Hyperlipidemia.  8.  Likely chronic renal failure.  CONSULTANTS: Dr. Arnoldo HookerBruce Kowalski.   PERTINENT LABORATORIES AND EVALUATIONS: Admitting glucose 117, BUN 22, creatinine 1.58, sodium 145, potassium 3.0, chloride 106, CO2 of 27, calcium was 8.5, LDL 99, cholesterol 161, triglycerides 112, HDL 40. LFTs were normal, except for an albumin of 3.2. Troponin 0.05,  0.04 and 0.06. Admitting WBC count 10.2, hemoglobin 14.7, platelet count was 306,000.   Echocardiogram of the heart showed LVEF less than 20%, severely decreased global left ventricular systolic function, mildly enlarged right ventricle, moderately dilated left atrium,  moderately dilated right atrium, moderate mitral regurgitation.  Chest x-ray showed cardiac enlargement without pulmonary vascular congestion.   HOSPITAL COURSE: Please refer to H and P done by the admitting physician. The patient is a 64 year old African American male who presented with complaint of shortness of breath. He does have significant systolic dysfunction. He was thought to have acute systolic CHF exacerbation. He was admitted and was treated with IV Lasix, with significant improvement in his symptoms. He was seen by cardiology, who concurred with the current plan. He  was also seen by the [facility] Clinic nurse, who will also follow the patient. The patient at this point is doing much better, and is stable for discharge.   DISCHARGE MEDICATIONS: Combivent 2 puffs 4 times a day, amlodipine/benazepril 5/20 mg 1 tablet p.o. daily, Colcrys 0.6 mg 1 tablet p.o. daily, Pravachol 20 daily, meloxicam 50 mg daily, Lasix 80 mg 1 tablet p.o. b.i.d., aspirin 81 mg 1 tablet p.o. daily, digoxin 125 mcg daily, carvedilol 12.5 mg 1 tablet p.o. b.i.d., spironolactone 50 mg 1 tablet p.o. b.i.d.   HOME OXYGEN: 2 L nasal cannula continuously.   DIET: Low-sodium, low-fat, low-cholesterol.   ACTIVITY: As tolerated.   DISCHARGE FOLLOWUP: With Dr. Juliann Paresallwood in 1-2 weeks. Dr. Maryellen PileEason in 1-2 weeks.    TIME SPENT ON THIS PATIENT: 35 minutes.    ____________________________ Lacie ScottsShreyang H. Allena KatzPatel, MD shp:MT D: 05/19/2014 16:59:42 ET T: 05/19/2014 17:16:46 ET JOB#: [id number]  cc: Jailyne Chieffo H. Allena KatzPatel, MD, <Dictator> Charise CarwinSHREYANG H Joshual Terrio MD ELECTRONICALLY SIGNED 05/22/2014 8:57

## 2014-08-23 ENCOUNTER — Ambulatory Visit: Payer: Self-pay | Admitting: Family

## 2014-08-31 ENCOUNTER — Inpatient Hospital Stay
Admission: EM | Admit: 2014-08-31 | Discharge: 2014-09-08 | DRG: 292 | Disposition: A | Discharge status: Home / Self Care | Payer: Medicare Other | Attending: Internal Medicine | Admitting: Internal Medicine

## 2014-08-31 ENCOUNTER — Emergency Department: Payer: Medicare Other

## 2014-08-31 ENCOUNTER — Encounter: Payer: Self-pay | Admitting: Emergency Medicine

## 2014-08-31 DIAGNOSIS — K219 Gastro-esophageal reflux disease without esophagitis: Secondary | ICD-10-CM | POA: Diagnosis present

## 2014-08-31 DIAGNOSIS — N183 Chronic kidney disease, stage 3 (moderate): Secondary | ICD-10-CM | POA: Diagnosis present

## 2014-08-31 DIAGNOSIS — J449 Chronic obstructive pulmonary disease, unspecified: Secondary | ICD-10-CM | POA: Diagnosis present

## 2014-08-31 DIAGNOSIS — Z6834 Body mass index (BMI) 34.0-34.9, adult: Secondary | ICD-10-CM

## 2014-08-31 DIAGNOSIS — M79673 Pain in unspecified foot: Secondary | ICD-10-CM | POA: Diagnosis present

## 2014-08-31 DIAGNOSIS — Z87891 Personal history of nicotine dependence: Secondary | ICD-10-CM | POA: Diagnosis not present

## 2014-08-31 DIAGNOSIS — N179 Acute kidney failure, unspecified: Secondary | ICD-10-CM | POA: Diagnosis present

## 2014-08-31 DIAGNOSIS — E875 Hyperkalemia: Secondary | ICD-10-CM | POA: Diagnosis not present

## 2014-08-31 DIAGNOSIS — G4730 Sleep apnea, unspecified: Secondary | ICD-10-CM | POA: Diagnosis present

## 2014-08-31 DIAGNOSIS — J9611 Chronic respiratory failure with hypoxia: Secondary | ICD-10-CM | POA: Diagnosis present

## 2014-08-31 DIAGNOSIS — I509 Heart failure, unspecified: Secondary | ICD-10-CM

## 2014-08-31 DIAGNOSIS — K5792 Diverticulitis of intestine, part unspecified, without perforation or abscess without bleeding: Secondary | ICD-10-CM | POA: Diagnosis present

## 2014-08-31 DIAGNOSIS — I129 Hypertensive chronic kidney disease with stage 1 through stage 4 chronic kidney disease, or unspecified chronic kidney disease: Secondary | ICD-10-CM | POA: Diagnosis present

## 2014-08-31 DIAGNOSIS — Z880 Allergy status to penicillin: Secondary | ICD-10-CM | POA: Diagnosis not present

## 2014-08-31 DIAGNOSIS — Z9981 Dependence on supplemental oxygen: Secondary | ICD-10-CM

## 2014-08-31 DIAGNOSIS — I5023 Acute on chronic systolic (congestive) heart failure: Principal | ICD-10-CM | POA: Diagnosis present

## 2014-08-31 DIAGNOSIS — G629 Polyneuropathy, unspecified: Secondary | ICD-10-CM | POA: Diagnosis present

## 2014-08-31 DIAGNOSIS — Z79899 Other long term (current) drug therapy: Secondary | ICD-10-CM | POA: Diagnosis not present

## 2014-08-31 DIAGNOSIS — M109 Gout, unspecified: Secondary | ICD-10-CM | POA: Diagnosis present

## 2014-08-31 DIAGNOSIS — J441 Chronic obstructive pulmonary disease with (acute) exacerbation: Secondary | ICD-10-CM | POA: Diagnosis present

## 2014-08-31 DIAGNOSIS — I429 Cardiomyopathy, unspecified: Secondary | ICD-10-CM | POA: Diagnosis present

## 2014-08-31 DIAGNOSIS — I4891 Unspecified atrial fibrillation: Secondary | ICD-10-CM | POA: Diagnosis present

## 2014-08-31 DIAGNOSIS — E785 Hyperlipidemia, unspecified: Secondary | ICD-10-CM | POA: Diagnosis present

## 2014-08-31 DIAGNOSIS — E876 Hypokalemia: Secondary | ICD-10-CM | POA: Diagnosis present

## 2014-08-31 DIAGNOSIS — G8929 Other chronic pain: Secondary | ICD-10-CM | POA: Diagnosis present

## 2014-08-31 HISTORY — DX: Diverticulitis of intestine, part unspecified, without perforation or abscess without bleeding: K57.92

## 2014-08-31 HISTORY — DX: Heart failure, unspecified: I50.9

## 2014-08-31 HISTORY — DX: Cardiac arrhythmia, unspecified: I49.9

## 2014-08-31 HISTORY — DX: Chronic obstructive pulmonary disease, unspecified: J44.9

## 2014-08-31 LAB — BASIC METABOLIC PANEL
ANION GAP: 9 (ref 5–15)
BUN: 24 mg/dL — AB (ref 6–20)
CO2: 30 mmol/L (ref 22–32)
Calcium: 8.6 mg/dL — ABNORMAL LOW (ref 8.9–10.3)
Chloride: 105 mmol/L (ref 101–111)
Creatinine, Ser: 1.81 mg/dL — ABNORMAL HIGH (ref 0.61–1.24)
GFR calc non Af Amer: 37 mL/min — ABNORMAL LOW (ref >60)
GFR, EST AFRICAN AMERICAN: 43 mL/min — AB (ref >60)
GLUCOSE: 133 mg/dL — AB (ref 65–99)
POTASSIUM: 3.1 mmol/L — AB (ref 3.5–5.1)
SODIUM: 144 mmol/L (ref 135–145)

## 2014-08-31 LAB — CBC
HCT: 44.6 % (ref 40.0–52.0)
HCT: 44.2 % (ref 40.0–52.0)
HEMOGLOBIN: 14.1 g/dL (ref 13.0–18.0)
Hemoglobin: 14.3 g/dL (ref 13.0–18.0)
MCH: 29.9 pg (ref 26.0–34.0)
MCH: 30.8 pg (ref 26.0–34.0)
MCHC: 31.6 g/dL — ABNORMAL LOW (ref 32.0–36.0)
MCHC: 32.4 g/dL (ref 32.0–36.0)
MCV: 94.6 fL (ref 80.0–100.0)
MCV: 95.4 fL (ref 80.0–100.0)
PLATELETS: 256 10*3/uL (ref 150–440)
PLATELETS: 230 10*3/uL (ref 150–440)
RBC: 4.71 MIL/uL (ref 4.40–5.90)
RBC: 4.64 MIL/uL (ref 4.40–5.90)
RDW: 15.9 % — ABNORMAL HIGH (ref 11.5–14.5)
RDW: 16 % — AB (ref 11.5–14.5)
WBC: 7.7 10*3/uL (ref 3.8–10.6)
WBC: 6.9 10*3/uL (ref 3.8–10.6)

## 2014-08-31 LAB — BRAIN NATRIURETIC PEPTIDE: B NATRIURETIC PEPTIDE 5: 528 pg/mL — AB (ref 0.0–100.0)

## 2014-08-31 LAB — TROPONIN I
TROPONIN I: 0.05 ng/mL — AB (ref <0.031)
Troponin I: 0.03 ng/mL (ref <0.031)

## 2014-08-31 LAB — CREATININE, SERUM
Creatinine, Ser: 1.81 mg/dL — ABNORMAL HIGH (ref 0.61–1.24)
GFR calc Af Amer: 43 mL/min — ABNORMAL LOW (ref >60)
GFR calc non Af Amer: 37 mL/min — ABNORMAL LOW (ref >60)

## 2014-08-31 MED ORDER — SENNOSIDES-DOCUSATE SODIUM 8.6-50 MG PO TABS: 1.0000 | ORAL_TABLET | Freq: Every evening | ORAL | Status: DC | PRN

## 2014-08-31 MED ORDER — FUROSEMIDE 10 MG/ML IJ SOLN
INTRAMUSCULAR | Status: AC
  Administered 2014-08-31: 40 mg via INTRAVENOUS
  Filled 2014-08-31: qty 4

## 2014-08-31 MED ORDER — CARVEDILOL 6.25 MG PO TABS
6.2500 mg | ORAL_TABLET | Freq: Two times a day (BID) | ORAL | Status: DC
  Administered 2014-09-01 – 2014-09-02 (×2): 6.25 mg via ORAL
  Filled 2014-08-31 (×3): qty 1

## 2014-08-31 MED ORDER — MELOXICAM 15 MG PO TABS
15.0000 mg | ORAL_TABLET | Freq: Every day | ORAL | Status: DC
  Administered 2014-08-31 – 2014-09-02 (×3): 15 mg via ORAL
  Filled 2014-08-31 (×3): qty 1

## 2014-08-31 MED ORDER — ACETAMINOPHEN 650 MG RE SUPP: 650.0000 mg | Freq: Four times a day (QID) | RECTAL | Status: DC | PRN

## 2014-08-31 MED ORDER — IPRATROPIUM-ALBUTEROL 20-100 MCG/ACT IN AERS
1.0000 | INHALATION_SPRAY | Freq: Four times a day (QID) | RESPIRATORY_TRACT | Status: DC
Start: 2014-08-31 — End: 2014-09-07
  Administered 2014-08-31 – 2014-09-07 (×24): 1 via RESPIRATORY_TRACT
  Filled 2014-08-31: qty 4

## 2014-08-31 MED ORDER — SODIUM CHLORIDE 0.9 % IJ SOLN
3.0000 mL | Freq: Two times a day (BID) | INTRAMUSCULAR | Status: DC
  Administered 2014-09-01 – 2014-09-07 (×15): 3 mL via INTRAVENOUS

## 2014-08-31 MED ORDER — ASPIRIN 81 MG PO CHEW
324.0000 mg | CHEWABLE_TABLET | Freq: Once | ORAL | Status: AC
  Administered 2014-08-31: 324 mg via ORAL

## 2014-08-31 MED ORDER — LISINOPRIL 20 MG PO TABS
20.0000 mg | ORAL_TABLET | Freq: Every day | ORAL | Status: DC
  Administered 2014-08-31 – 2014-09-08 (×7): 20 mg via ORAL
  Filled 2014-08-31 (×10): qty 1

## 2014-08-31 MED ORDER — ONDANSETRON HCL 4 MG/2ML IJ SOLN
4.0000 mg | Freq: Four times a day (QID) | INTRAMUSCULAR | Status: DC | PRN
  Administered 2014-09-02: 4 mg via INTRAVENOUS
  Filled 2014-08-31: qty 2

## 2014-08-31 MED ORDER — FUROSEMIDE 10 MG/ML IJ SOLN
40.0000 mg | Freq: Once | INTRAMUSCULAR | Status: AC
  Administered 2014-08-31: 40 mg via INTRAVENOUS

## 2014-08-31 MED ORDER — ASPIRIN 81 MG PO CHEW
CHEWABLE_TABLET | ORAL | Status: AC
  Administered 2014-08-31: 324 mg via ORAL
  Filled 2014-08-31: qty 4

## 2014-08-31 MED ORDER — HEPARIN SODIUM (PORCINE) 5000 UNIT/ML IJ SOLN
5000.0000 [IU] | Freq: Three times a day (TID) | INTRAMUSCULAR | Status: DC
  Administered 2014-08-31 – 2014-09-07 (×20): 5000 [IU] via SUBCUTANEOUS
  Filled 2014-08-31 (×20): qty 1

## 2014-08-31 MED ORDER — ASPIRIN EC 81 MG PO TBEC
81.0000 mg | DELAYED_RELEASE_TABLET | Freq: Every day | ORAL | Status: DC
  Administered 2014-09-01 – 2014-09-08 (×8): 81 mg via ORAL
  Filled 2014-08-31 (×8): qty 1

## 2014-08-31 MED ORDER — COLCHICINE 0.6 MG PO TABS
0.6000 mg | ORAL_TABLET | Freq: Every day | ORAL | Status: DC
  Administered 2014-08-31 – 2014-09-08 (×9): 0.6 mg via ORAL
  Filled 2014-08-31 (×9): qty 1

## 2014-08-31 MED ORDER — FUROSEMIDE 10 MG/ML IJ SOLN
40.0000 mg | Freq: Two times a day (BID) | INTRAMUSCULAR | Status: DC
  Administered 2014-08-31 – 2014-09-01 (×2): 40 mg via INTRAVENOUS
  Filled 2014-08-31 (×2): qty 4

## 2014-08-31 MED ORDER — DOCUSATE SODIUM 100 MG PO CAPS
100.0000 mg | ORAL_CAPSULE | Freq: Two times a day (BID) | ORAL | Status: DC
Start: 2014-08-31 — End: 2014-09-08
  Administered 2014-08-31 – 2014-09-08 (×14): 100 mg via ORAL
  Filled 2014-08-31 (×16): qty 1

## 2014-08-31 MED ORDER — PRAVASTATIN SODIUM 20 MG PO TABS
20.0000 mg | ORAL_TABLET | Freq: Every day | ORAL | Status: DC
  Administered 2014-08-31 – 2014-09-08 (×9): 20 mg via ORAL
  Filled 2014-08-31 (×9): qty 1

## 2014-08-31 MED ORDER — TORSEMIDE 20 MG PO TABS
100.0000 mg | ORAL_TABLET | Freq: Every day | ORAL | Status: DC
  Administered 2014-08-31 – 2014-09-01 (×2): 100 mg via ORAL
  Filled 2014-08-31 (×2): qty 5

## 2014-08-31 MED ORDER — TIOTROPIUM BROMIDE MONOHYDRATE 18 MCG IN CAPS
18.0000 ug | ORAL_CAPSULE | Freq: Every day | RESPIRATORY_TRACT | Status: DC
  Administered 2014-09-01 – 2014-09-08 (×8): 18 ug via RESPIRATORY_TRACT
  Filled 2014-08-31 (×2): qty 5

## 2014-08-31 MED ORDER — ONDANSETRON HCL 4 MG PO TABS: 4.0000 mg | ORAL_TABLET | Freq: Four times a day (QID) | ORAL | Status: DC | PRN

## 2014-08-31 MED ORDER — ACETAMINOPHEN 325 MG PO TABS: 650.0000 mg | ORAL_TABLET | Freq: Four times a day (QID) | ORAL | Status: DC | PRN

## 2014-08-31 NOTE — ED Notes (Signed)
Patient with lower extremity swelling since yesterday. Reports chronic dyspnea on exertion. 4L home O2. Hx COPD.

## 2014-08-31 NOTE — ED Provider Notes (Signed)
[facility] Emergency Department Provider Note  ____________________________________________  Time seen: Approximately 1:00 PM  I have reviewed the triage vital signs and the nursing notes.   HISTORY  Chief Complaint Leg Swelling    HPI George Hopkins is a 64 y.o. male with history of CHF (EF less than 20%), hypertension, COPD with 3-4 L home oxygen requirement, irregular heartbeat, gout, hyperlipidemia presents for evaluation of 3 days of worsening bilateral lower extremity edema, dyspnea on exertion as well as orthopnea. No chest pain.Gradual onset, worsening despite compliance with torsemide. Current severity 9 out of 10. No cough, sneezing, runny nose, congestion, vomiting, diarrhea or fever.   Past Medical History  Diagnosis Date  . Hypertension   . CHF (congestive heart failure)   . COPD (chronic obstructive pulmonary disease)   . Diverticulitis   . Irregular heart beat     There are no active problems to display for this patient.   Past Surgical History  Procedure Laterality Date  . Cardiac catheterization      Current Outpatient Rx  Name  Route  Sig  Dispense  Refill  . amLODipine-benazepril (LOTREL) 5-20 MG per capsule   Oral   Take 1 capsule by mouth daily.         . carvedilol (COREG) 6.25 MG tablet   Oral   Take 6.25 mg by mouth 2 (two) times daily with a meal.         . colchicine (COLCRYS) 0.6 MG tablet   Oral   Take 0.6 mg by mouth daily.         Marland Kitchen. docusate sodium (COLACE) 100 MG capsule   Oral   Take 100 mg by mouth 2 (two) times daily.         . Ipratropium-Albuterol (COMBIVENT) 20-100 MCG/ACT AERS respimat   Inhalation   Inhale 1 puff into the lungs every 6 (six) hours.         Marland Kitchen. lisinopril (PRINIVIL,ZESTRIL) 20 MG tablet   Oral   Take 20 mg by mouth daily.         . meloxicam (MOBIC) 15 MG tablet   Oral   Take 15 mg by mouth daily.         . pravastatin (PRAVACHOL) 20 MG tablet   Oral    Take 20 mg by mouth daily.         Marland Kitchen. tiotropium (SPIRIVA) 18 MCG inhalation capsule   Inhalation   Place 18 mcg into inhaler and inhale daily.         Marland Kitchen. torsemide (DEMADEX) 100 MG tablet   Oral   Take 100 mg by mouth daily.         . OXYGEN-HELIUM IN   Inhalation   Inhale into the lungs.           Allergies Penicillins  No family history on file.  Social History History  Substance Use Topics  . Smoking status: Former Games developermoker  . Smokeless tobacco: Never Used  . Alcohol Use: No    Review of Systems Constitutional: No fever/chills Eyes: No visual changes. ENT: No sore throat. Cardiovascular: Denies chest pain. Respiratory: + shortness of breath. Gastrointestinal: No abdominal pain.  No nausea, no vomiting.  No diarrhea.  No constipation. Genitourinary: Negative for dysuria. Musculoskeletal: Negative for back pain. Skin: Negative for rash. Neurological: Negative for headaches, focal weakness or numbness. 10-point ROS otherwise negative.  ____________________________________________   PHYSICAL EXAM:  VITAL SIGNS: ED Triage Vitals  Enc Vitals  Group     BP 08/31/14 1059 118/60 mmHg     Pulse Rate 08/31/14 1059 112     Resp 08/31/14 1059 19     Temp 08/31/14 1059 98.4 F (36.9 C)     Temp Source 08/31/14 1059 Oral     SpO2 08/31/14 1059 92 %     Weight 08/31/14 1059 247 lb (112.038 kg)     Height 08/31/14 1059 5\' 11"  (1.803 m)     Head Cir --      Peak Flow --      Pain Score --      Pain Loc --      Pain Edu? --      Excl. in GC? --     Constitutional: Alert and oriented. Chronically ill-appearing and in no acute distress. Eyes: Conjunctivae are normal. PERRL. EOMI. Head: Atraumatic. Nose: No congestion/rhinnorhea. Mouth/Throat: Mucous membranes are moist.  Oropharynx non-erythematous. Neck: No stridor.   Cardiovascular: tachycardic rate, regular rhythm. Grossly normal heart sounds.  Good peripheral circulation. Respiratory: Moderate  tachypnea, mildly increased work of breathing,in full sentences, slightly diminished breath sounds bilaterally Gastrointestinal: Soft and nontender. No distention. No abdominal bruits. No CVA tenderness. Genitourinary: Deferred Musculoskeletal: 2+ pitting edema bilateral lower extremities; swelling and tenderness of bilateral feet Neurologic:  Normal speech and language. No gross focal neurologic deficits are appreciated. Speech is normal.  Skin:  Skin is warm, dry and intact. No rash noted. Psychiatric: Mood and affect are normal. Speech and behavior are normal.  ____________________________________________   LABS (all labs ordered are listed, but only abnormal results are displayed)  Labs Reviewed  CBC - Abnormal; Notable for the following:    MCHC 31.6 (*)    RDW 15.9 (*)    All other components within normal limits  BASIC METABOLIC PANEL - Abnormal; Notable for the following:    Potassium 3.1 (*)    Glucose, Bld 133 (*)    BUN 24 (*)    Creatinine, Ser 1.81 (*)    Calcium 8.6 (*)    GFR calc non Af Amer 37 (*)    GFR calc Af Amer 43 (*)    All other components within normal limits  BRAIN NATRIURETIC PEPTIDE - Abnormal; Notable for the following:    B Natriuretic Peptide 528.0 (*)    All other components within normal limits  TROPONIN I - Abnormal; Notable for the following:    Troponin I 0.05 (*)    All other components within normal limits   ____________________________________________  EKG  ED ECG REPORT   Date: 08/31/2014  EKG Time: 11:07  Rate: 113  Rhythm: Atrial flutter with 2-1 AV conduction  Axis: Normal  Intervals:left bundle branch block  ST&T Change: No acute ST segment change  ____________________________________________  RADIOLOGY  CXR FINDINGS: Cardiomegaly is again noted with prominent interstitial markings and indistinctness of the perihilar vasculature most consistent with interstitial edema. No pleural effusion is seen. No bony  abnormality is noted.  IMPRESSION: Findings consistent with interstitial edema. Cardiomegaly. ____________________________________________   PROCEDURES  Procedure(s) performed: None  Critical Care performed: No  ____________________________________________   INITIAL IMPRESSION / ASSESSMENT AND PLAN / ED COURSE  Pertinent labs & imaging results that were available during my care of the patient were reviewed by me and considered in my medical decision making (see chart for details).  George Hopkins is a 64 y.o. male with history of CHF (EF less than 20%), hypertension, COPD with 3-4 L home oxygen requirement,  irregular heartbeat, gout, hyperlipidemia presents for evaluation of 3 days of worsening bilateral lower extremity edema, dyspnea on exertion as well as orthopnea. Mildly tachycardic but also mildly hypotensive so will hold off on additional rate control for his heart rate. Clinical picture is most consistent with acute CHF exacerbation. BNP is elevated. Chest x-ray concerning for interstitial edema. Troponin elevated at 0.05 however his troponin appears to be chronically elevated and today's value is improved from prior. We'll give aspirin, IV Lasix. Discussed with hospitalist for admission. ____________________________________________   FINAL CLINICAL IMPRESSION(S) / ED DIAGNOSES  Final diagnoses:  Acute exacerbation of CHF (congestive heart failure)      Gayla Doss, MD 08/31/14 1346

## 2014-08-31 NOTE — H&P (Signed)
[facility] [place] [facility]   PATIENT NAME: George Hopkins    MR#:  [id number]  DATE OF BIRTH:  [birth year]  DATE OF ADMISSION:  08/31/2014  PRIMARY CARE PHYSICIAN: Kathlee NationsEASON,PAUL, MD   REQUESTING/REFERRING PHYSICIAN: Dr. Inocencio HomesGayle  CHIEF COMPLAINT:  Shortness of breath and lower extremity edema HISTORY OF PRESENT ILLNESS:  George Hopkins  is a 64 y.o. male with a known history of systolic heart failure with ejection fraction less than 20%, hypertension, chronic respiratory failure on 3-4 L of home oxygen with COPD who presents with above complaint. Patient states over the past few days patient had increasing lower extremity edema, increased abdominal girth and shortness of breath. Patient says he is compliant with his medications and he has been compliant with his low-sodium diet. She denies any chest pain or shortness of breath.  PAST MEDICAL HISTORY:   Past Medical History  Diagnosis Date  . Hypertension   . CHF (congestive heart failure)   . COPD (chronic obstructive pulmonary disease)   . Diverticulitis   . Irregular heart beat     PAST SURGICAL HISTOIRY:   Past Surgical History  Procedure Laterality Date  . Cardiac catheterization      SOCIAL HISTORY:   History  Substance Use Topics  . Smoking status: Former Games developermoker  . Smokeless tobacco: Never Used  . Alcohol Use: No    FAMILY HISTORY:  No family history on file.  DRUG ALLERGIES:   Allergies  Allergen Reactions  . Penicillins Swelling    REVIEW OF SYSTEMS:  CONSTITUTIONAL: No fever, +fatigue and weakness.  EYES: No blurred or double vision.  EARS, NOSE, AND THROAT: No tinnitus or ear pain.  RESPIRATORY: No cough, +shortness of breath, no wheezing or hemoptysis.  CARDIOVASCULAR: No chest pain, orthopnea, + lower extremity edema.  GASTROINTESTINAL: No nausea, vomiting, diarrhea or abdominal pain.  GENITOURINARY: No dysuria, hematuria.  ENDOCRINE: No polyuria, nocturia,   HEMATOLOGY: No anemia, easy bruising or bleeding SKIN: No rash or lesion. MUSCULOSKELETAL: No joint pain or arthritis.   NEUROLOGIC: No tingling, numbness, weakness.  PSYCHIATRY: No anxiety or depression.   MEDICATIONS AT HOME:   Prior to Admission medications   Medication Sig Start Date End Date Taking? Authorizing Provider  amLODipine-benazepril (LOTREL) 5-20 MG per capsule Take 1 capsule by mouth daily.   Yes Historical Provider, MD  carvedilol (COREG) 6.25 MG tablet Take 6.25 mg by mouth 2 (two) times daily with a meal.   Yes Historical Provider, MD  colchicine (COLCRYS) 0.6 MG tablet Take 0.6 mg by mouth daily.   Yes Historical Provider, MD  docusate sodium (COLACE) 100 MG capsule Take 100 mg by mouth 2 (two) times daily.   Yes Historical Provider, MD  Ipratropium-Albuterol (COMBIVENT) 20-100 MCG/ACT AERS respimat Inhale 1 puff into the lungs every 6 (six) hours.   Yes Historical Provider, MD  lisinopril (PRINIVIL,ZESTRIL) 20 MG tablet Take 20 mg by mouth daily.   Yes Historical Provider, MD  meloxicam (MOBIC) 15 MG tablet Take 15 mg by mouth daily.   Yes Historical Provider, MD  pravastatin (PRAVACHOL) 20 MG tablet Take 20 mg by mouth daily.   Yes Historical Provider, MD  tiotropium (SPIRIVA) 18 MCG inhalation capsule Place 18 mcg into inhaler and inhale daily.   Yes Historical Provider, MD  torsemide (DEMADEX) 100 MG tablet Take 100 mg by mouth daily.   Yes Historical Provider, MD  OXYGEN-HELIUM IN Inhale into the lungs.    Historical Provider, MD  VITAL SIGNS:  Blood pressure 102/60, pulse 111, temperature 98.4 F (36.9 C), temperature source Oral, resp. rate 24, height  (1.803 m), weight 112.038 kg (247 lb), SpO2 97 %.  PHYSICAL EXAMINATION:  GENERAL:  64 y.o.-year-old obese patient lying in the bed with no acute distress.  EYES: Pupils equal, round, reactive to light and accommodation. No scleral icterus. Extraocular muscles intact.  HEENT: Head atraumatic,  normocephalic. Oropharynx and nasopharynx clear.  NECK:  Supple, no jugular venous distention. No thyroid enlargement, no tenderness.  LUNGS: Patient has bilateral crackles at bases of lung. Patient does not appear to be in acute respiratory distress. There is no rhonchi or wheezing heard. CARDIOVASCULAR: S1, S2 normal. No murmurs, rubs, or gallops.  ABDOMEN: Soft, nontender, nondistended. Bowel sounds present. No organomegaly or mass.  EXTREMITIES: +3+ lower extremity edema, no cyanosis  or clubbing.  NEUROLOGIC: Cranial nerves II through XII are intact. Gait not checked.  PSYCHIATRIC: The patient is alert and oriented x 3.  SKIN: No obvious rash, lesion, or ulcer.   LABORATORY PANEL:   CBC  Recent Labs Lab 08/31/14 1106  WBC 7.7  HGB 14.1  HCT 44.6  PLT 256   ------------------------------------------------------------------------------------------------------------------  Chemistries   Recent Labs Lab 08/31/14 1106  NA 144  K 3.1*  CL 105  CO2 30  GLUCOSE 133*  BUN 24*  CREATININE 1.81*  CALCIUM 8.6*   ------------------------------------------------------------------------------------------------------------------  Cardiac Enzymes  Recent Labs Lab 08/31/14 1106  TROPONINI 0.05*   ------------------------------------------------------------------------------------------------------------------  RADIOLOGY:  Dg Chest 2 View  08/31/2014   CLINICAL DATA:  Lower extremity swelling, history of CHF and COPD  EXAM: CHEST  2 VIEW  COMPARISON:  Chest x-ray of 07/31/2014 and 06/09/2014  FINDINGS: Cardiomegaly is again noted with prominent interstitial markings and indistinctness of the perihilar vasculature most consistent with interstitial edema. No pleural effusion is seen. No bony abnormality is noted.  IMPRESSION: Findings consistent with interstitial edema.  Cardiomegaly.   Electronically Signed   By: Dwyane Dee M.D.   On: 08/31/2014 12:06    EKG:  Atrial flutter  HR 113 no st elevation           IMPRESSION AND PLAN:  This is a 64 year old male with a history of systolic heart failure with EF less than 20%, chronic respiratory failure on 3-4 L of home oxygen, and morbid obesity who presents with shortness of breath, dyspnea exertion, and lower extremity edema for the past few days. He is noted be in acute systolic heart failure.  1. Acute on chronic systolic heart failure: Patient has an elevated BNP, a chest x-ray consistent with pulmonary edema, lower extremity edema and shortness of breath all representing acute systolic heart failure. I will start Lasix 40 IV every 12 hours. We will continue to monitor I's and O's along with daily weight. I have consulted his cardiologist after call Thurmond Butts for further evaluation and treatment. Patient is already on an ACE inhibitor and beta blocker which I will continue. 2. Chronic respiratory failure with COPD: Patient does not appear to be in acute exacerbation of COPD. We will continue with oxygen.  3. Essential hypertension: Patient will continue on ACE inhibitor and due to blocker. We will monitor his blood pressure closely  4. Hypokalemia: I have written for potassium supplementation. We will follow BMP in a.m.  5. Atrial fibrillation: Patient has a known diagnosis of irregular heartbeat. I will consult the cardiology for further evaluation and management. His heart rate seems to be controlled  at this time. He will continue on beta blocker.    All the records are reviewed and case discussed with ED provider. Management plans discussed with the patient and he is in agreement.  CODE STATUS: Full  TOTAL TIME TAKING CARE OF THIS PATIENT: 35 minutes.    Koichi Platte M.D on 08/31/2014 at 2:23 PM  Between 7am to 6pm - Pager - [phone] After 6pm go to [email or web address] - password [email or web address] [email or web address]  [facility]  Office  [phone]  CC: Primary care physician; Kathlee NationsEASON,PAUL, MD

## 2014-08-31 NOTE — ED Notes (Signed)
Patient sitting on stretcher. Alert and oriented. Using urinal without complication. Given sandwich and juice. Awaiting bed assignment.

## 2014-08-31 NOTE — ED Notes (Signed)
Patient called about SPO2 monitor beeping. Finger adapter changed and monitor reading better. O2 at 4L. Patient declines any further assistance.

## 2014-09-01 LAB — BASIC METABOLIC PANEL
Anion gap: 8 (ref 5–15)
BUN: 25 mg/dL — AB (ref 6–20)
CHLORIDE: 107 mmol/L (ref 101–111)
CO2: 30 mmol/L (ref 22–32)
Calcium: 8.4 mg/dL — ABNORMAL LOW (ref 8.9–10.3)
Creatinine, Ser: 1.79 mg/dL — ABNORMAL HIGH (ref 0.61–1.24)
GFR calc Af Amer: 44 mL/min — ABNORMAL LOW (ref >60)
GFR, EST NON AFRICAN AMERICAN: 38 mL/min — AB (ref >60)
Glucose, Bld: 150 mg/dL — ABNORMAL HIGH (ref 65–99)
POTASSIUM: 3.2 mmol/L — AB (ref 3.5–5.1)
Sodium: 145 mmol/L (ref 135–145)

## 2014-09-01 LAB — TROPONIN I
TROPONIN I: 0.05 ng/mL — AB (ref <0.031)
Troponin I: 0.03 ng/mL (ref <0.031)

## 2014-09-01 MED ORDER — TRAZODONE HCL 50 MG PO TABS
25.0000 mg | ORAL_TABLET | Freq: Every evening | ORAL | Status: DC | PRN
  Administered 2014-09-02: 25 mg via ORAL
  Filled 2014-09-01: qty 1

## 2014-09-01 MED ORDER — POTASSIUM CHLORIDE CRYS ER 20 MEQ PO TBCR
40.0000 meq | EXTENDED_RELEASE_TABLET | Freq: Once | ORAL | Status: DC
  Filled 2014-09-01: qty 2

## 2014-09-01 MED ORDER — FUROSEMIDE 10 MG/ML IJ SOLN
40.0000 mg | Freq: Four times a day (QID) | INTRAMUSCULAR | Status: DC
  Administered 2014-09-01 – 2014-09-02 (×4): 40 mg via INTRAVENOUS
  Filled 2014-09-01 (×4): qty 4

## 2014-09-01 NOTE — Consult Note (Signed)
Reason for Consult: Congestive heart failure , leg edema and  atrial fibrillation Referring Physician: Dr Lisbeth Renshaw Primary physician Dr. Hubert Azure George Hopkins is an 64 y.o. male.  HPI: Patient is a 64 year old African-American male with known history of congestive heart failure with systolic dysfunction appears to have a nonischemic cardiomyopathy with ejection fraction of less than 25% patient's had significant dyspnea or shortness of breath and leg swelling recently patient has COPD and is on home oxygen. Patient's had worsening shortness of breath has a history of smoking, quit a few years ago he's had leg swelling dyspnea persistent shortness of breath denies any chest pain he's had palpitations and tachycardia but presented for evaluation of shortness of breath and leg edema.  Past Medical History  Diagnosis Date  . Hypertension   . CHF (congestive heart failure)   . COPD (chronic obstructive pulmonary disease)   . Diverticulitis   . Irregular heart beat     Past Surgical History  Procedure Laterality Date  . Cardiac catheterization      History reviewed. No pertinent family history.  Social History:  reports that he has quit smoking. He has never used smokeless tobacco. He reports that he does not drink alcohol or use illicit drugs.  Allergies:  Allergies  Allergen Reactions  . Penicillins Swelling    Medications:  Prior to Admission:  Prescriptions prior to admission  Medication Sig Dispense Refill Last Dose  . amLODipine-benazepril (LOTREL) 5-20 MG per capsule Take 1 capsule by mouth daily.   08/31/2014 at Unknown time  . carvedilol (COREG) 6.25 MG tablet Take 6.25 mg by mouth 2 (two) times daily with a meal.   08/31/2014 at Unknown time  . colchicine (COLCRYS) 0.6 MG tablet Take 0.6 mg by mouth daily.   08/31/2014 at Unknown time  . docusate sodium (COLACE) 100 MG capsule Take 100 mg by mouth 2 (two) times daily.   08/31/2014 at Unknown time  . Ipratropium-Albuterol  (COMBIVENT) 20-100 MCG/ACT AERS respimat Inhale 1 puff into the lungs every 6 (six) hours.   08/31/2014 at Unknown time  . lisinopril (PRINIVIL,ZESTRIL) 20 MG tablet Take 20 mg by mouth daily.   08/31/2014 at Unknown time  . meloxicam (MOBIC) 15 MG tablet Take 15 mg by mouth daily.   08/31/2014 at Unknown time  . pravastatin (PRAVACHOL) 20 MG tablet Take 20 mg by mouth daily.   08/31/2014 at Unknown time  . tiotropium (SPIRIVA) 18 MCG inhalation capsule Place 18 mcg into inhaler and inhale daily.   08/31/2014 at Unknown time  . torsemide (DEMADEX) 100 MG tablet Take 100 mg by mouth daily.   08/31/2014 at Unknown time  . OXYGEN-HELIUM IN Inhale into the lungs.       Results for orders placed or performed during the hospital encounter of 08/31/14 (from the past 48 hour(s))  BNP (order ONLY if patient complains of dyspnea/SOB AND you have documented it for THIS visit)     Status: Abnormal   Collection Time: 08/31/14 11:04 AM  Result Value Ref Range   B Natriuretic Peptide 528.0 (H) 0.0 - 100.0 pg/mL  CBC     Status: Abnormal   Collection Time: 08/31/14 11:06 AM  Result Value Ref Range   WBC 7.7 3.8 - 10.6 K/uL   RBC 4.71 4.40 - 5.90 MIL/uL   Hemoglobin 14.1 13.0 - 18.0 g/dL   HCT 44.6 40.0 - 52.0 %   MCV 94.6 80.0 - 100.0 fL   MCH 29.9 26.0 - 34.0  pg   MCHC 31.6 (L) 32.0 - 36.0 g/dL   RDW 15.9 (H) 11.5 - 14.5 %   Platelets 256 150 - 440 K/uL  Basic metabolic panel     Status: Abnormal   Collection Time: 08/31/14 11:06 AM  Result Value Ref Range   Sodium 144 135 - 145 mmol/L   Potassium 3.1 (L) 3.5 - 5.1 mmol/L   Chloride 105 101 - 111 mmol/L   CO2 30 22 - 32 mmol/L   Glucose, Bld 133 (H) 65 - 99 mg/dL   BUN 24 (H) 6 - 20 mg/dL   Creatinine, Ser 1.81 (H) 0.61 - 1.24 mg/dL   Calcium 8.6 (L) 8.9 - 10.3 mg/dL   GFR calc non Af Amer 37 (L) >60 mL/min   GFR calc Af Amer 43 (L) >60 mL/min    Comment: (NOTE) The eGFR has been calculated using the CKD EPI equation. This calculation has not  been validated in all clinical situations. eGFR's persistently <60 mL/min signify possible Chronic Kidney Disease.    Anion gap 9 5 - 15  Troponin I     Status: Abnormal   Collection Time: 08/31/14 11:06 AM  Result Value Ref Range   Troponin I 0.05 (H) <0.031 ng/mL    Comment: READ BACK AND VERIFIED JILL COTRONE AT 8101 08/31/14 DAS        PERSISTENTLY INCREASED TROPONIN VALUES IN THE RANGE OF 0.04-0.49 ng/mL CAN BE SEEN IN:       -UNSTABLE ANGINA       -CONGESTIVE HEART FAILURE       -MYOCARDITIS       -CHEST TRAUMA       -ARRYHTHMIAS       -LATE PRESENTING MYOCARDIAL INFARCTION       -COPD   CLINICAL FOLLOW-UP RECOMMENDED.   CBC     Status: Abnormal   Collection Time: 08/31/14  8:15 PM  Result Value Ref Range   WBC 6.9 3.8 - 10.6 K/uL   RBC 4.64 4.40 - 5.90 MIL/uL   Hemoglobin 14.3 13.0 - 18.0 g/dL   HCT 44.2 40.0 - 52.0 %   MCV 95.4 80.0 - 100.0 fL   MCH 30.8 26.0 - 34.0 pg   MCHC 32.4 32.0 - 36.0 g/dL   RDW 16.0 (H) 11.5 - 14.5 %   Platelets 230 150 - 440 K/uL  Creatinine, serum     Status: Abnormal   Collection Time: 08/31/14  8:15 PM  Result Value Ref Range   Creatinine, Ser 1.81 (H) 0.61 - 1.24 mg/dL   GFR calc non Af Amer 37 (L) >60 mL/min   GFR calc Af Amer 43 (L) >60 mL/min    Comment: (NOTE) The eGFR has been calculated using the CKD EPI equation. This calculation has not been validated in all clinical situations. eGFR's persistently <60 mL/min signify possible Chronic Kidney Disease.   Troponin I     Status: None   Collection Time: 08/31/14  8:15 PM  Result Value Ref Range   Troponin I 0.03 <0.031 ng/mL    Comment:        NO INDICATION OF MYOCARDIAL INJURY.   Basic metabolic panel     Status: Abnormal   Collection Time: 09/01/14  2:48 AM  Result Value Ref Range   Sodium 145 135 - 145 mmol/L   Potassium 3.2 (L) 3.5 - 5.1 mmol/L   Chloride 107 101 - 111 mmol/L   CO2 30 22 - 32 mmol/L   Glucose, Bld 150 (  H) 65 - 99 mg/dL   BUN 25 (H) 6 - 20  mg/dL   Creatinine, Ser 1.79 (H) 0.61 - 1.24 mg/dL   Calcium 8.4 (L) 8.9 - 10.3 mg/dL   GFR calc non Af Amer 38 (L) >60 mL/min   GFR calc Af Amer 44 (L) >60 mL/min    Comment: (NOTE) The eGFR has been calculated using the CKD EPI equation. This calculation has not been validated in all clinical situations. eGFR's persistently <60 mL/min signify possible Chronic Kidney Disease.    Anion gap 8 5 - 15  Troponin I     Status: Abnormal   Collection Time: 09/01/14  2:48 AM  Result Value Ref Range   Troponin I 0.05 (H) <0.031 ng/mL    Comment: READ BACK AND VERIFIED BY CRYSTAL BEASLEY _0  09/01/14 BY AJO        PERSISTENTLY INCREASED TROPONIN VALUES IN THE RANGE OF 0.04-0.49 ng/mL CAN BE SEEN IN:       -UNSTABLE ANGINA       -CONGESTIVE HEART FAILURE       -MYOCARDITIS       -CHEST TRAUMA       -ARRYHTHMIAS       -LATE PRESENTING MYOCARDIAL INFARCTION       -COPD   CLINICAL FOLLOW-UP RECOMMENDED.   Troponin I     Status: None   Collection Time: 09/01/14  7:56 AM  Result Value Ref Range   Troponin I 0.03 <0.031 ng/mL    Comment:        NO INDICATION OF MYOCARDIAL INJURY.     Dg Chest 2 View  08/31/2014   CLINICAL DATA:  Lower extremity swelling, history of CHF and COPD  EXAM: CHEST  2 VIEW  COMPARISON:  Chest x-ray of 07/31/2014 and 06/09/2014  FINDINGS: Cardiomegaly is again noted with prominent interstitial markings and indistinctness of the perihilar vasculature most consistent with interstitial edema. No pleural effusion is seen. No bony abnormality is noted.  IMPRESSION: Findings consistent with interstitial edema.  Cardiomegaly.   Electronically Signed   By: Ivar Drape M.D.   On: 08/31/2014 12:06    Review of Systems  Constitutional: Negative.   HENT: Negative.   Eyes: Negative.   Respiratory: Positive for shortness of breath.   Cardiovascular: Positive for palpitations, orthopnea, leg swelling and PND.  Gastrointestinal: Negative.   Genitourinary: Negative.    Musculoskeletal: Positive for myalgias and joint pain.  Skin: Negative.   Neurological: Negative.   Endo/Heme/Allergies: Negative.   Psychiatric/Behavioral: Negative.    Blood pressure 103/73, pulse 111, temperature 97.5 F (36.4 C), temperature source Oral, resp. rate 20, height _1  (1.803 m), weight 124.512 kg (274 lb 8 oz), SpO2 97 %. Physical Exam  Constitutional: He appears well-developed and well-nourished.  HENT:  Head: Normocephalic.  Eyes: Conjunctivae are normal. Pupils are equal, round, and reactive to light.  Neck: Normal range of motion. JVD present.  Cardiovascular: S1 normal, S2 normal and intact distal pulses.  An irregularly irregular rhythm present. Tachycardia present.  PMI is displaced.  Exam reveals distant heart sounds.   Murmur heard.  Systolic murmur is present with a grade of 2/6  Respiratory: He has rales.  GI: Soft.  Musculoskeletal: He exhibits edema.  Neurological: He is alert.  Skin: Skin is warm.    Assessment/Plan: Congestive heart failure systolic dysfunction acute on chronic Cardiomyopathy systolic EF 74% Lower extremity edema bilateral Hypokalemia Atrial fibrillation Shortness of breath COPD Obesity Hypertension Possible obstructive sleep apnea .  Plan Agree with admitted on telemetry rule out of myocardial infarction Continue telemetry monitoring because of atrial fibrillation Recommend increased diuretic therapy for heart failure and leg edema Replace potassium with hypokalemia Supplemental oxygen therapy for shortness of breath Support stockings and elevation for edema patient's short-term and long-term because of atrial fibrillation and DVT prophylaxis Consider inhalers for COPD with persistent shortness of breath Continue blood pressure control Consider discontinuing amlodipine because of leg edema Continue Coreg and Zestril for heart failure therapy Continue current therapy Conservative sleep study for possible obstructive  sleep apnea Recommend weight loss and exercise portion control  George Hopkins D. 09/01/2014, 2:03 PM

## 2014-09-01 NOTE — Progress Notes (Signed)
Alert and oriented. Blood pressure borderline low, did not give coreg or lisinopril this morning. Patient on 4L of oxygen, chronic. Patient experiencing shortness of breath at rest. Giving IV lasix. Will continue to monitor.

## 2014-09-01 NOTE — Progress Notes (Signed)
RD Assessment  Admitted with: CHF PMHx: HTN, CHF, COPD  Current Diet: Heart Healthy Typical Food/ Fluid Intake: 20% intake recorded per I/O since admission Meal/ Snack Patterns: Pt reports eating a regular, low salt diet PTA. He reports a good appetite and intake PTA.   Supplements: none  Food Allergies: NKFA Food Preferences: Reviewed  Ht: 71" Current weight: 274# BMI: 34.5 Weight Changes: Pt reports a UBW of around 240-250#. Current weight is a significant weight gain over UBW- most likely fluid related to dx.  UOP: 600 ml/ 24 hrs Digestive: Last BM 5/11 Gastrointestinal: Reviewed Skin: WDL Physical Findings: n/a  Labs: Electrolyte and Renal Profile:    Recent Labs Lab 08/31/14 1106 08/31/14 2015 09/01/14 0248  BUN 24*  --  25*  CREATININE 1.81* 1.81* 1.79*  NA 144  --  145  K 3.1*  --  3.2*    Meds: Colace, Lasix  PES Statement: No nutrition concerns at this time.  Intervention: Meals and Snacks: Cater to patient preferences  Monitoring/ Evaluation: Energy Intake: goal for patient to meet >90% of estimated needs.  LOW Care Level

## 2014-09-01 NOTE — Care Management Note (Signed)
Case Management Note  Patient Details  Name: Toya SmothersFrancis N Celaya MRN: [id number] Date of Birth: 05-07-1950  Subjective/Objective:                    Action/Plan:   Expected Discharge Date:                  Expected Discharge Plan:     In-House Referral:     Discharge planning Services     Post Acute Care Choice:    Choice offered to:     DME Arranged:    DME Agency:     HH Arranged:    HH Agency:     Status of Service:     Medicare Important Message Given:   yes Date Medicare IM Given:   09/01/14 Medicare IM give by:   Ermalene SearingNann Kerrie Timm Date Additional Medicare IM Given:    Additional Medicare Important Message give by:     If discussed at Long Length of Stay Meetings, dates discussed:    Additional Comments:  Eber HongGreene, Maxen Rowland R, RN 09/01/2014, 8:27 AM

## 2014-09-01 NOTE — Progress Notes (Signed)
A&O. Independent. No complaints. Slept well through the night. O2 at 4L. As at home. IV lasix given. No s/s distress.

## 2014-09-01 NOTE — Care Management (Signed)
Third admission for chf since jan 2016.  Patient has adamantly declined home health or chf clinic follow up on all previous admissions.  Have concerns regarding compliance but patient is not willing to accept any type of follow up post discharge.  As on previous admission, discussed the need for patient to have his portable 02 tank brought to the hospital at discharge .  At last discharge he did not arrange this with his transport and left before a portable tank could be delivered by [facility].

## 2014-09-01 NOTE — Progress Notes (Signed)
Notified Dr. Betti Cruzeddy of Potassium of 3.2 and troponin of 0.05. PO potassium ordered.

## 2014-09-01 NOTE — Progress Notes (Signed)
[facility]  at [facility]   PATIENT NAME: George RamFrancis Rotundo    MR#:  [id number]  DATE OF BIRTH:  17-Jan-1951  SUBJECTIVE:   Still short of breath and swollen.   REVIEW OF SYSTEMS:  CONSTITUTIONAL: No fever, fatigue or weakness.  EYES: No blurred or double vision.  EARS, NOSE, AND THROAT: No tinnitus or ear pain.  RESPIRATORY: short of breath at rest and with exertion CARDIOVASCULAR: No chest pain,  + orthopnea, + edema.  GASTROINTESTINAL: No nausea, vomiting, diarrhea or abdominal pain.  GENITOURINARY: No dysuria, hematuria.  ENDOCRINE: No polyuria, nocturia,  HEMATOLOGY: No anemia, easy bruising or bleeding SKIN: No rash or lesion. MUSCULOSKELETAL: No joint pain or arthritis.   NEUROLOGIC: No tingling, numbness, weakness.  PSYCHIATRY: No anxiety or depression.   DRUG ALLERGIES:   Allergies  Allergen Reactions  . Penicillins Swelling    VITALS:  Blood pressure 103/73, pulse 111, temperature 97.5 F (36.4 C), temperature source Oral, resp. rate 20, height 5\' 11"  (1.803 m), weight 124.512 kg (274 lb 8 oz), SpO2 97 %.  PHYSICAL EXAMINATION:  GENERAL:  64 y.o.-year-old patient lying in the bed with no acute distress. On nasal canula. EYES: Pupils equal, round, reactive to light and accommodation. No scleral icterus. Extraocular muscles intact.  HEENT: Head atraumatic, normocephalic. Oropharynx and nasopharynx clear.  NECK:  Supple, no jugular venous distention. No thyroid enlargement, no tenderness.  LUNGS: crackles and decreased breath sounds at both bases CARDIOVASCULAR: S1, S2 normal. No murmurs, rubs, or gallops. Distant heart sounds. ABDOMEN: Soft, nontender, nondistended. Bowel sounds present. No organomegaly or mass.  EXTREMITIES: +2 pedal edema, no cyanosis, or clubbing.  NEUROLOGIC: Cranial nerves II through XII are intact. Muscle strength 5/5 in all extremities. Sensation intact. Gait not checked.  PSYCHIATRIC: The patient is alert  and oriented x 3.  SKIN: No obvious rash, lesion, or ulcer.    LABORATORY PANEL:   CBC  Recent Labs Lab 08/31/14 2015  WBC 6.9  HGB 14.3  HCT 44.2  PLT 230   ------------------------------------------------------------------------------------------------------------------  Chemistries   Recent Labs Lab 09/01/14 0248  NA 145  K 3.2*  CL 107  CO2 30  GLUCOSE 150*  BUN 25*  CREATININE 1.79*  CALCIUM 8.4*   ------------------------------------------------------------------------------------------------------------------  Cardiac Enzymes  Recent Labs Lab 09/01/14 0756  TROPONINI 0.03   ------------------------------------------------------------------------------------------------------------------  RADIOLOGY:  Dg Chest 2 View  08/31/2014   CLINICAL DATA:  Lower extremity swelling, history of CHF and COPD  EXAM: CHEST  2 VIEW  COMPARISON:  Chest x-ray of 07/31/2014 and 06/09/2014  FINDINGS: Cardiomegaly is again noted with prominent interstitial markings and indistinctness of the perihilar vasculature most consistent with interstitial edema. No pleural effusion is seen. No bony abnormality is noted.  IMPRESSION: Findings consistent with interstitial edema.  Cardiomegaly.   Electronically Signed   By: Dwyane DeePaul  Barry M.D.   On: 08/31/2014 12:06    EKG:   Orders placed or performed during the hospital encounter of 08/31/14  . ED EKG (<10910mins upon arrival to the ED)  . ED EKG (<2910mins upon arrival to the ED)  . EKG 12-Lead  . EKG 12-Lead    ASSESSMENT AND PLAN:   This is a 64 year old male with a history of systolic heart failure with EF less than 20%, chronic respiratory failure on 3-4 L of home oxygen, and morbid obesity who presents with shortness of breath, dyspnea exertion, and lower extremity edema for the past few days. He is noted be in  acute systolic heart failure.  1. Acute on chronic systolic heart failure:  - appreciate Cardiology consultation - Will  continue with increase diuresis to Lasix 40 mg IV TID, daily weights, low sodium diet - monitor renal function - continue BB and ACE  2. Chronic respiratory failure with COPD:  - add nebs, continue home inhalers - continue 02 is on 2-3 L at home  3. Essential hypertension:  - Patient will continue on ACE inhibitor and due to blocker.  - We will monitor his blood pressure closely in setting of duiresis  4. Hypokalemia: - replace and recheck.  5. Atrial fibrillation:  - fairly well controlled, continue BB  All the records are reviewed and case discussed with Care Management/Social Workerr. Management plans discussed with the patient, family and they are in agreement.  CODE STATUS: FULL  TOTAL TIME TAKING CARE OF THIS PATIENT: 30 minutes.   POSSIBLE D/C IN 2 DAYS, DEPENDING ON CLINICAL CONDITION.   Elby ShowersWALSH, Jalysa Swopes M.D on 09/01/2014 at 4:38 PM  Between 7am to 6pm - Pager - [phone]  After 6pm go to [email or web address] - password [email or web address] [facility]  Office  [phone]  CC: Primary care physician; Kathlee NationsEASON,PAUL, MD

## 2014-09-02 LAB — BASIC METABOLIC PANEL
Anion gap: 8 (ref 5–15)
BUN: 24 mg/dL — ABNORMAL HIGH (ref 6–20)
CO2: 32 mmol/L (ref 22–32)
Calcium: 8.2 mg/dL — ABNORMAL LOW (ref 8.9–10.3)
Chloride: 105 mmol/L (ref 101–111)
Creatinine, Ser: 1.68 mg/dL — ABNORMAL HIGH (ref 0.61–1.24)
GFR calc Af Amer: 47 mL/min — ABNORMAL LOW (ref >60)
GFR, EST NON AFRICAN AMERICAN: 41 mL/min — AB (ref >60)
GLUCOSE: 117 mg/dL — AB (ref 65–99)
Potassium: 3.2 mmol/L — ABNORMAL LOW (ref 3.5–5.1)
Sodium: 145 mmol/L (ref 135–145)

## 2014-09-02 LAB — CBC
HEMATOCRIT: 45.6 % (ref 40.0–52.0)
HEMOGLOBIN: 14.7 g/dL (ref 13.0–18.0)
MCH: 30.8 pg (ref 26.0–34.0)
MCHC: 32.1 g/dL (ref 32.0–36.0)
MCV: 95.9 fL (ref 80.0–100.0)
Platelets: 240 10*3/uL (ref 150–440)
RBC: 4.76 MIL/uL (ref 4.40–5.90)
RDW: 16.3 % — ABNORMAL HIGH (ref 11.5–14.5)
WBC: 6.9 10*3/uL (ref 3.8–10.6)

## 2014-09-02 MED ORDER — GABAPENTIN 100 MG PO CAPS
100.0000 mg | ORAL_CAPSULE | Freq: Two times a day (BID) | ORAL | Status: DC
  Administered 2014-09-02 – 2014-09-03 (×3): 100 mg via ORAL
  Filled 2014-09-02 (×3): qty 1

## 2014-09-02 MED ORDER — BUDESONIDE 0.5 MG/2ML IN SUSP
0.2500 mg | Freq: Two times a day (BID) | RESPIRATORY_TRACT | Status: DC
  Administered 2014-09-02 – 2014-09-03 (×3): 0.25 mg via RESPIRATORY_TRACT
  Administered 2014-09-04: 0.5 mg via RESPIRATORY_TRACT
  Administered 2014-09-04: 0.25 mg via RESPIRATORY_TRACT
  Administered 2014-09-05 (×2): 0.5 mg via RESPIRATORY_TRACT
  Filled 2014-09-02 (×7): qty 2

## 2014-09-02 MED ORDER — CARVEDILOL 12.5 MG PO TABS
12.5000 mg | ORAL_TABLET | Freq: Two times a day (BID) | ORAL | Status: DC
  Administered 2014-09-02 – 2014-09-04 (×4): 12.5 mg via ORAL
  Filled 2014-09-02 (×4): qty 1

## 2014-09-02 MED ORDER — POTASSIUM CHLORIDE CRYS ER 20 MEQ PO TBCR
40.0000 meq | EXTENDED_RELEASE_TABLET | Freq: Once | ORAL | Status: AC
  Administered 2014-09-02: 40 meq via ORAL

## 2014-09-02 MED ORDER — DEXTROSE 5 % IV SOLN
100.0000 mg | Freq: Two times a day (BID) | INTRAVENOUS | Status: DC
  Administered 2014-09-02 – 2014-09-04 (×3): 100 mg via INTRAVENOUS
  Filled 2014-09-02 (×7): qty 10

## 2014-09-02 MED ORDER — POTASSIUM CHLORIDE CRYS ER 20 MEQ PO TBCR
20.0000 meq | EXTENDED_RELEASE_TABLET | Freq: Two times a day (BID) | ORAL | Status: DC
  Administered 2014-09-02 – 2014-09-03 (×4): 20 meq via ORAL
  Filled 2014-09-02 (×4): qty 1

## 2014-09-02 NOTE — Progress Notes (Signed)
Reassessment performed due to low o2 sats on nasal cannula earlier. Found patient back on 4liter nasal cannula with o2 sat of 87%. Patient states not able to tolerate venturi mask. States feels light headed if o2 is above 4liters so he turned flow back to 4. His saturation did come up with deep breathing exercises however I stated such may drop with shallow breathing. Reported findings to rn. Told patient will will continue to monitor levels to see if other interventions are needed.

## 2014-09-02 NOTE — Progress Notes (Signed)
Subjective:  Patient still has significant leg swelling and shortness of breath feels a little bit better but still has symptomatic heart symptoms. Denies significant chest pain denies black stools or syncope.  Objective:  Vital Signs in the last 24 hours: Temp:  [98 F (36.7 C)-98.7 F (37.1 C)] 98.3 F (36.8 C) (05/14 1108) Pulse Rate:  [111-114] 112 (05/14 1108) Resp:  [18-20] 20 (05/14 1108) BP: (107-130)/(62-83) 107/62 mmHg (05/14 1108) SpO2:  [94 %-100 %] 96 % (05/14 1108) Weight:  [125.828 kg (277 lb 6.4 oz)] 125.828 kg (277 lb 6.4 oz) (05/14 0504)  Intake/Output from previous day: 05/13 0701 - 05/14 0700 In: 843 [P.O.:840; I.V.:3] Out: 1450 [Urine:1450] Intake/Output from this shift: Total I/O In: 360 [P.O.:360] Out: 0   Physical Exam: General appearance: alert, cooperative and moderately obese Neck: no adenopathy, no carotid bruit, no JVD, supple, symmetrical, trachea midline and thyroid not enlarged, symmetric, no tenderness/mass/nodules Lungs: diminished breath sounds bibasilar, dullness to percussion bibasilar and rales bibasilar Heart: normal apical impulse, regular rate and rhythm, S1, S2 normal and systolic murmur: early systolic 2/6, low pitch at 2nd left intercostal space Abdomen: soft, non-tender; bowel sounds normal; no masses,  no organomegaly Extremities: edema 2+ edema bilaterally Pulses: 2+ and symmetric Skin: Skin color, texture, turgor normal. No rashes or lesions Neurologic: Grossly normal  Lab Results:  Recent Labs  08/31/14 2015 09/02/14 0446  WBC 6.9 6.9  HGB 14.3 14.7  PLT 230 240    Recent Labs  09/01/14 0248 09/02/14 0446  NA 145 145  K 3.2* 3.2*  CL 107 105  CO2 30 32  GLUCOSE 150* 117*  BUN 25* 24*  CREATININE 1.79* 1.68*    Recent Labs  09/01/14 0248 09/01/14 0756  TROPONINI 0.05* 0.03   Hepatic Function Panel No results for input(s): PROT, ALBUMIN, AST, ALT, ALKPHOS, BILITOT, BILIDIR, IBILI in the last 72 hours. No  results for input(s): CHOL in the last 72 hours. No results for input(s): PROTIME in the last 72 hours.  Imaging: No results found.  Cardiac Studies:  Assessment/Plan:  Cardiomyopathy CHF Edema Palpitations Shortness of Breath  Obesity Hypokalemia Chronic renal insufficiency Borderline troponin Possible obstructive sleep apnea Hypoxemia . PLAN Continue Lasix therapy intravenously to help with diuresis Potassium supplementation for hypokalemia Continue Coreg for heart failure 12.5 twice a day Pravachol therapy for hyperlipidemia Recommend support stockings for edema Continue colchicine for gout symptoms Consider hydralazine therapy to help with heart failure therapy Consider adding Imdur in addition to hydralazine Continue the follow-up renal insufficiency consider nephrology input Continue supplemental oxygen for hypoxemia Consider CPAP therapy for possible obstructive sleep apnea  LOS: 2 days    CALLWOOD,DWAYNE D. 09/02/2014, 3:22 PM

## 2014-09-02 NOTE — Progress Notes (Signed)
A&O. Independent. O2 at 4L. Short of breath with exertion. Afib in the 110's. Trazodone to help with sleep. Request that the doctor address the nerve pain in his feet. Will pass this on to day shift. IV lasix given.

## 2014-09-02 NOTE — Progress Notes (Signed)
Patient ID: George Hopkins, male   DOB: 09-02-1950, 64 y.o.   MRN: [id number] [facility] PROGRESS NOTE  George SmothersFrancis N Hopkins WUJ:[id number] DOB: 09-02-1950 DOA: 08/31/2014 PCP: Kathlee NationsEASON,PAUL, MD  HPI/Subjective: Patient with some wheeze, always has trouble with his breathing. He is very swollen going on for a while. Not urinating as much here. His major complaint is pain on his feet with numbness and tingling and discoloration of his feet.  Objective: Filed Vitals:   09/02/14 1108  BP: 107/62  Pulse: 112  Temp: 98.3 F (36.8 C)  Resp: 20    Intake/Output Summary (Last 24 hours) at 09/02/14 1441 Last data filed at 09/02/14 1349  Gross per 24 hour  Intake    600 ml  Output   1050 ml  Net   -450 ml   Filed Weights   08/31/14 1959 09/01/14 0642 09/02/14 0504  Weight: 124.966 kg (275 lb 8 oz) 124.512 kg (274 lb 8 oz) 125.828 kg (277 lb 6.4 oz)    ROS: Review of Systems  Constitutional: Negative for fever and chills.  Eyes: Negative for blurred vision.  Respiratory: Positive for cough, shortness of breath and wheezing.   Cardiovascular: Negative for chest pain.  Gastrointestinal: Negative for nausea, vomiting, diarrhea and constipation.  Genitourinary: Negative for dysuria.  Musculoskeletal: Negative for joint pain.  Neurological: Negative for dizziness and headaches.   Exam: Physical Exam  HENT:  Nose: No mucosal edema.  Mouth/Throat: No oropharyngeal exudate or posterior oropharyngeal edema.  Eyes: Conjunctivae, EOM and lids are normal. Pupils are equal, round, and reactive to light.  Neck: No JVD present. Carotid bruit is not present. No edema present. No thyroid mass and no thyromegaly present.  Cardiovascular: S1 normal and S2 normal.  Tachycardia present.  Exam reveals no gallop.   Murmur heard.  Systolic murmur is present with a grade of 3/6  Pulses:      Dorsalis pedis pulses are 1+ on the right side, and 1+ on the left side.  Respiratory: No  respiratory distress. He has no wheezes. He has no rhonchi. He has no rales.  GI: Soft. Bowel sounds are normal. There is no tenderness.  Musculoskeletal:       Right ankle: He exhibits swelling.       Left ankle: He exhibits swelling.  Lymphadenopathy:    He has no cervical adenopathy.  Neurological: He is alert. No cranial nerve deficit.  Skin: Skin is warm. No rash noted. Nails show no clubbing.  Psychiatric: He has a normal mood and affect.      Data Reviewed: Basic Metabolic Panel:  Recent Labs Lab 08/31/14 1106 08/31/14 2015 09/01/14 0248 09/02/14 0446  NA 144  --  145 145  K 3.1*  --  3.2* 3.2*  CL 105  --  107 105  CO2 30  --  30 32  GLUCOSE 133*  --  150* 117*  BUN 24*  --  25* 24*  CREATININE 1.81* 1.81* 1.79* 1.68*  CALCIUM 8.6*  --  8.4* 8.2*   CBC:  Recent Labs Lab 08/31/14 1106 08/31/14 2015 09/02/14 0446  WBC 7.7 6.9 6.9  HGB 14.1 14.3 14.7  HCT 44.6 44.2 45.6  MCV 94.6 95.4 95.9  PLT 256 230 240   Cardiac Enzymes:  Recent Labs Lab 08/31/14 1106 08/31/14 2015 09/01/14 0248 09/01/14 0756  TROPONINI 0.05* 0.03 0.05* 0.03   BNP (last 3 results)  Recent Labs  08/31/14 1104  BNP 528.0*    Scheduled  Meds: . aspirin EC  81 mg Oral Daily  . carvedilol  12.5 mg Oral BID WC  . colchicine  0.6 mg Oral Daily  . docusate sodium  100 mg Oral BID  . furosemide  100 mg Intravenous BID  . gabapentin  100 mg Oral BID  . heparin  5,000 Units Subcutaneous 3 times per day  . Ipratropium-Albuterol  1 puff Inhalation Q6H  . lisinopril  20 mg Oral Daily  . meloxicam  15 mg Oral Daily  . potassium chloride  40 mEq Oral Once  . pravastatin  20 mg Oral Daily  . sodium chloride  3 mL Intravenous Q12H  . tiotropium  18 mcg Inhalation Daily   Continuous Infusions:   Assessment/Plan: Active Problems:   CHF (congestive heart failure)   1. Acute on chronic systolic congestive heart failure. Last EF 15% back in February 2016. I will increase dose of  Coreg to 12.5 mg twice a day, continue lisinopril. Patient's weights are about the same I will increase Lasix to 100 mg IV twice a day. 2. Chronic respiratory failure with hypoxia on oxygen continuous here in the hospital and at home 3 L. History of COPD. With some wheezing, I will add budesonide nebulizers. 3. Hypokalemia replace oral potassium while on Lasix. 4. Neuropathy unspecified- I will add low-dose gabapentin. 5. Chronic kidney disease stage III- watch closely with diuresis 6. Hyperlipidemia unspecified- continue pravastatin.  Code Status:     Code Status Orders        Start     Ordered   08/31/14 1937  Full code   Continuous     08/31/14 1936     Disposition Plan: Home with home health  Time spent: 30 minutes  Alford HighlandWIETING, Jerret Mcbane  [facility]

## 2014-09-03 LAB — BASIC METABOLIC PANEL
Anion gap: 8 (ref 5–15)
BUN: 31 mg/dL — ABNORMAL HIGH (ref 6–20)
CALCIUM: 8.3 mg/dL — AB (ref 8.9–10.3)
CO2: 30 mmol/L (ref 22–32)
Chloride: 106 mmol/L (ref 101–111)
Creatinine, Ser: 2.01 mg/dL — ABNORMAL HIGH (ref 0.61–1.24)
GFR calc Af Amer: 38 mL/min — ABNORMAL LOW (ref >60)
GFR, EST NON AFRICAN AMERICAN: 33 mL/min — AB (ref >60)
Glucose, Bld: 116 mg/dL — ABNORMAL HIGH (ref 65–99)
Potassium: 4.1 mmol/L (ref 3.5–5.1)
SODIUM: 144 mmol/L (ref 135–145)

## 2014-09-03 MED ORDER — METHYLPREDNISOLONE SODIUM SUCC 40 MG IJ SOLR
40.0000 mg | Freq: Two times a day (BID) | INTRAMUSCULAR | Status: DC
  Administered 2014-09-03 – 2014-09-06 (×6): 40 mg via INTRAVENOUS
  Filled 2014-09-03 (×6): qty 1

## 2014-09-03 MED ORDER — GABAPENTIN 100 MG PO CAPS
200.0000 mg | ORAL_CAPSULE | Freq: Two times a day (BID) | ORAL | Status: DC
  Administered 2014-09-03 – 2014-09-07 (×8): 200 mg via ORAL
  Filled 2014-09-03 (×8): qty 2

## 2014-09-03 NOTE — Progress Notes (Signed)
Patient ID: George Hopkins Topper, male   DOB: 07/08/1950, 64 y.o.   MRN: [id number] [facility] PROGRESS [facility]  George Hopkins Console QMV:[id number] DOB: 07/08/1950 DOA: 08/31/2014 PCP: Kathlee NationsEASON,PAUL, MD  HPI/Subjective: Patient states that his feet feel the same with pain and numbness and tingling. Patient states that he is having some chest congestion and today and feels tight in the chest. Some cough and wheeze. With the increase in Lasix is not urinating as much.  Objective: Filed Vitals:   09/03/14 1059  BP: 97/67  Pulse: 108  Temp: 97.6 F (36.4 C)  Resp: 20    Intake/Output Summary (Last 24 hours) at 09/03/14 1248 Last data filed at 09/03/14 1144  Gross per 24 hour  Intake    720 ml  Output   2450 ml  Net  -1730 ml   Filed Weights   09/01/14 0642 09/02/14 0504 09/03/14 0445  Weight: 124.512 kg (274 lb 8 oz) 125.828 kg (277 lb 6.4 oz) 127.597 kg (281 lb 4.8 oz)    ROS: Review of Systems  Constitutional: Negative for fever and chills.  Eyes: Negative for blurred vision.  Respiratory: Positive for cough and shortness of breath.   Cardiovascular: Positive for chest pain.  Gastrointestinal: Negative for nausea, vomiting, diarrhea and constipation.  Genitourinary: Negative for dysuria.  Musculoskeletal: Negative for joint pain.  Skin: Negative for rash.  Neurological: Positive for tingling. Negative for dizziness and headaches.   Exam: Physical Exam  HENT:  Nose: No mucosal edema.  Mouth/Throat: No oropharyngeal exudate or posterior oropharyngeal edema.  Eyes: Conjunctivae, EOM and lids are normal. Pupils are equal, round, and reactive to light.  Neck: No JVD present. Carotid bruit is not present. No edema present. No thyroid mass and no thyromegaly present.  Cardiovascular: S1 normal and S2 normal.  Tachycardia present.  Exam reveals no gallop.   Murmur heard.  Systolic murmur is present with a grade of 3/6  Pulses:      Dorsalis pedis pulses are 1+ on the  right side, and 1+ on the left side.  Respiratory: No respiratory distress. He has decreased breath sounds in the right lower field and the left lower field. He has wheezes in the right lower field and the left lower field. He has no rhonchi. He has no rales.  GI: Soft. Bowel sounds are normal. There is no tenderness.  Musculoskeletal:       Right ankle: He exhibits swelling.       Left ankle: He exhibits swelling.  Lymphadenopathy:    He has no cervical adenopathy.  Neurological: He is alert. No cranial nerve deficit.  Skin: Skin is warm. No rash noted. Nails show no clubbing.  Psychiatric: He has a normal mood and affect.   skin exam continued: Chronic lower extremity discoloration on the feet bilaterally.  Data Reviewed: Basic Metabolic Panel:  Recent Labs Lab 08/31/14 1106 08/31/14 2015 09/01/14 0248 09/02/14 0446 09/03/14 0452  NA 144  --  145 145 144  K 3.1*  --  3.2* 3.2* 4.1  CL 105  --  107 105 106  CO2 30  --  30 32 30  GLUCOSE 133*  --  150* 117* 116*  BUN 24*  --  25* 24* 31*  CREATININE 1.81* 1.81* 1.79* 1.68* 2.01*  CALCIUM 8.6*  --  8.4* 8.2* 8.3*   CBC:  Recent Labs Lab 08/31/14 1106 08/31/14 2015 09/02/14 0446  WBC 7.7 6.9 6.9  HGB 14.1 14.3 14.7  HCT 44.6  44.2 45.6  MCV 94.6 95.4 95.9  PLT 256 230 240   Cardiac Enzymes:  Recent Labs Lab 08/31/14 1106 08/31/14 2015 09/01/14 0248 09/01/14 0756  TROPONINI 0.05* 0.03 0.05* 0.03   BNP (last 3 results)  Recent Labs  08/31/14 1104  BNP 528.0*    Scheduled Meds: . aspirin EC  81 mg Oral Daily  . budesonide (PULMICORT) nebulizer solution  0.25 mg Nebulization BID  . carvedilol  12.5 mg Oral BID WC  . colchicine  0.6 mg Oral Daily  . docusate sodium  100 mg Oral BID  . furosemide  100 mg Intravenous BID  . gabapentin  200 mg Oral BID  . heparin  5,000 Units Subcutaneous 3 times per day  . Ipratropium-Albuterol  1 puff Inhalation Q6H  . lisinopril  20 mg Oral Daily  . methylPREDNISolone  (SOLU-MEDROL) injection  40 mg Intravenous Q12H  . potassium chloride  20 mEq Oral BID  . potassium chloride  40 mEq Oral Once  . pravastatin  20 mg Oral Daily  . sodium chloride  3 mL Intravenous Q12H  . tiotropium  18 mcg Inhalation Daily   Continuous Infusions:   Assessment/Plan: Active Problems:   CHF (congestive heart failure)   1. Acute on chronic systolic congestive heart failure. Last EF 15% back in February 2016. I will increase dose of Coreg to 12.5 mg twice a day, continue lisinopril. Increased Lasix to 100 mg IV twice a day yesterday. I's and O's show that he is -1.7 L but his weights have been increasing. A lot of times they do daily weights in the bed which are inaccurate. 2. Chronic respiratory failure with hypoxia on oxygen continuous here in the hospital and at home 3 L. History of COPD. With some wheezing, continue budesonide nebulizers and add low-dose Solu-Medrol. 3. Hypokalemia replace oral potassium while on Lasix. 4. Neuropathy unspecified- I will add low-dose gabapentin. 5. Chronic kidney disease stage III- creatinine starting to creep up. Need to watch closely with diuresis. We'll check tomorrow a.m. and monitor. 6. Hyperlipidemia unspecified- continue pravastatin.  Code Status:     Code Status Orders        Start     Ordered   08/31/14 1937  Full code   Continuous     08/31/14 1936     Disposition Plan: Home with home health  Time spent: 25 minutes  Alford HighlandWIETING, Myleigh Amara  [facility]

## 2014-09-03 NOTE — Progress Notes (Signed)
Subjective:  Pt staes the he has had mild improvement in swelling and edema. He still has some sob but denies cp.  Objective:  Vital Signs in the last 24 hours: Temp:  [97.3 F (36.3 C)-98.5 F (36.9 C)] 97.6 F (36.4 C) (05/15 1059) Pulse Rate:  [108-110] 108 (05/15 1059) Resp:  [18-24] 20 (05/15 1059) BP: (92-102)/(59-69) 97/67 mmHg (05/15 1059) SpO2:  [85 %-100 %] 100 % (05/15 1059) FiO2 (%):  [50 %] 50 % (05/14 2054) Weight:  [127.597 kg (281 lb 4.8 oz)] 127.597 kg (281 lb 4.8 oz) (05/15 0445)  Intake/Output from previous day: 05/14 0701 - 05/15 0700 In: 600 [P.O.:600] Out: 1150 [Urine:1150] Intake/Output from this shift: Total I/O In: 720 [P.O.:720] Out: 1650 [Urine:1650]  Physical Exam: General appearance: alert and cooperative Neck: no adenopathy, no carotid bruit, no JVD, supple, symmetrical, trachea midline and thyroid not enlarged, symmetric, no tenderness/mass/nodules Lungs: diminished breath sounds bibasilar, bilaterally and LLL, dullness to percussion bibasilar and rales bilaterally Heart: irregularly irregular rhythm and systolic murmur: early systolic 2/6, low pitch at 2nd left intercostal space Abdomen: soft, non-tender; bowel sounds normal; no masses,  no organomegaly Extremities: extremities normal, atraumatic, no cyanosis or edema Pulses: 2+ and symmetric Skin: Skin color, texture, turgor normal. No rashes or lesions Neurologic: Alert and oriented X 3, normal strength and tone. Normal symmetric reflexes. Normal coordination and gait  Lab Results:  Recent Labs  08/31/14 2015 09/02/14 0446  WBC 6.9 6.9  HGB 14.3 14.7  PLT 230 240    Recent Labs  09/02/14 0446 09/03/14 0452  NA 145 144  K 3.2* 4.1  CL 105 106  CO2 32 30  GLUCOSE 117* 116*  BUN 24* 31*  CREATININE 1.68* 2.01*    Recent Labs  09/01/14 0248 09/01/14 0756  TROPONINI 0.05* 0.03   Hepatic Function Panel No results for input(s): PROT, ALBUMIN, AST, ALT, ALKPHOS, BILITOT,  BILIDIR, IBILI in the last 72 hours. No results for input(s): CHOL in the last 72 hours. No results for input(s): PROTIME in the last 72 hours.  Imaging: No results found.  Cardiac Studies:  Assessment/Plan:  Arrhythmia Atrial Fibrillation Cardiomyopathy CHF Edema Palpitations Shortness of Breath  Obesity Hypoxemia CRI . PLAN Continue diuretics for edema Supplimental 02 for hypoxemia Agree with hydralazine/Imdur Recommend avoid ACE Consider nephrology input for CRI Recommend weight loss add exercise Continue blocker therapy Medical therapy for heart failure  LOS: 3 days    Jaray Boliver D. 09/03/2014, 5:58 PM

## 2014-09-03 NOTE — Progress Notes (Signed)
Pt. Continues with dyspnea upon exertion. Pt. had no c/o pain or SOB. No acute distress observed during the night. Pt. Rested peacefully during the night. Pt. Independently toileted himself during the night. Edema continues to BLE. Will continue to monitor pt. Tele continues running at sinus tach bundle block.   

## 2014-09-03 NOTE — Evaluation (Signed)
Physical Therapy Evaluation Patient Details Name: Toya SmothersFrancis N Sabree MRN: [id number] DOB: February 11, 1951 Today's Date: 09/03/2014   History of Present Illness  PT here with CHF and COPD exacerbation  Clinical Impression  Pt is a [age] y/o male here with RVR.  Pt is able to to do all required mobility acts and though he has some shortness of breath and fatigue he has no safety concerns and is able to ambulate w/o issue.  He is on 4 liters (uses 3 liters at home) but his sats remain between 88-93% the entire time. Pt feels confident going home and agrees that he does not need further PT intervention.     Follow Up Recommendations No PT follow up    Equipment Recommendations       Recommendations for Other Services       Precautions / Restrictions Precautions Precautions:  (moderate fall risk) Restrictions Weight Bearing Restrictions: No      Mobility  Bed Mobility Overal bed mobility: Independent                Transfers Overall transfer level: Independent                  Ambulation/Gait Ambulation/Gait assistance: Independent Ambulation Distance (Feet): 100 Feet Assistive device: None       General Gait Details: Pt walks with good confidence and speed.  He has no LOBs and though he has some fatigue his o2 remains 88-93% the entire effort on 4 liters.  Stairs            Wheelchair Mobility    Modified Rankin (Stroke Patients Only)       Balance                                             Pertinent Vitals/Pain Pain Assessment: No/denies pain    Home Living Family/patient expects to be discharged to:: Private residence Living Arrangements: Alone                    Prior Function Level of Independence: Independent               Hand Dominance        Extremity/Trunk Assessment   Upper Extremity Assessment: Overall WFL for tasks assessed           Lower Extremity Assessment: Overall WFL for tasks  assessed         Communication   Communication: No difficulties  Cognition Arousal/Alertness: Awake/alert Behavior During Therapy: WFL for tasks assessed/performed Overall Cognitive Status: Within Functional Limits for tasks assessed                      General Comments      Exercises        Assessment/Plan    PT Assessment Patent does not need any further PT services  PT Diagnosis     PT Problem List    PT Treatment Interventions     PT Goals (Current goals can be found in the Care Plan section) Acute Rehab PT Goals Patient Stated Goal: Get back home    Frequency     Barriers to discharge        Co-evaluation               End of Session Equipment Utilized During Treatment: Gait belt Activity  Tolerance: Patient limited by fatigue Patient left: in bed           Time: 1414-1441 PT Time Calculation (min) (ACUTE ONLY): 27 min   Charges:   PT Evaluation $Initial PT Evaluation Tier I: 1 Procedure     PT G Codes:       Loran SentersGalen Carmino Ocain, PT, DPT [phone]  Malachi ProGalen R Severo Beber 09/03/2014, 4:31 PM

## 2014-09-04 LAB — MAGNESIUM: Magnesium: 2.2 mg/dL (ref 1.7–2.4)

## 2014-09-04 LAB — BASIC METABOLIC PANEL
Anion gap: 6 (ref 5–15)
BUN: 41 mg/dL — AB (ref 6–20)
CALCIUM: 8.5 mg/dL — AB (ref 8.9–10.3)
CO2: 31 mmol/L (ref 22–32)
Chloride: 105 mmol/L (ref 101–111)
Creatinine, Ser: 2.28 mg/dL — ABNORMAL HIGH (ref 0.61–1.24)
GFR calc Af Amer: 33 mL/min — ABNORMAL LOW (ref >60)
GFR calc non Af Amer: 28 mL/min — ABNORMAL LOW (ref >60)
GLUCOSE: 130 mg/dL — AB (ref 65–99)
Potassium: 5.2 mmol/L — ABNORMAL HIGH (ref 3.5–5.1)
Sodium: 142 mmol/L (ref 135–145)

## 2014-09-04 MED ORDER — ALUM & MAG HYDROXIDE-SIMETH 200-200-20 MG/5ML PO SUSP: 30.0000 mL | Freq: Four times a day (QID) | ORAL | Status: DC | PRN

## 2014-09-04 MED ORDER — CARVEDILOL 25 MG PO TABS
25.0000 mg | ORAL_TABLET | Freq: Two times a day (BID) | ORAL | Status: DC
Start: 2014-09-04 — End: 2014-09-06
  Administered 2014-09-04 – 2014-09-06 (×4): 25 mg via ORAL
  Filled 2014-09-04 (×4): qty 1

## 2014-09-04 MED ORDER — FAMOTIDINE 20 MG PO TABS
20.0000 mg | ORAL_TABLET | Freq: Two times a day (BID) | ORAL | Status: DC
  Administered 2014-09-04 – 2014-09-08 (×9): 20 mg via ORAL
  Filled 2014-09-04 (×9): qty 1

## 2014-09-04 NOTE — Progress Notes (Signed)
Let MD Hilton SinclairWeiting know that when pt goes to sleep on 4L of oxygen he desats to 70% and while awake he is 90%. Will follow.

## 2014-09-04 NOTE — Progress Notes (Signed)
VSS. Coreg was increased. Pt has not reported any pain. 4 L of oxygen. A fib. Bipap was added at nightime due to desat while sleeping.Takes meds ok. A & O. Up in the room and tolerating in the room. EF 15%. K is high at 5.2. Urinal. Pt has no further concerns at this time.

## 2014-09-04 NOTE — Care Management (Signed)
When medically stable, patient anticipates discharge home.  Discussed the need to anticipate having his portable 02 tank brought from home .  He again declines home health. Patient would most definitely benefit from this service.

## 2014-09-04 NOTE — Progress Notes (Signed)
Patient ID: George Hopkins, male   DOB: 1951-02-02, 64 y.o.   MRN: [id number] [facility] PROGRESS NOTE  George Hopkins [id number] DOB: 1951-02-02 DOA: 08/31/2014 PCP: Kathlee NationsEASON,PAUL, MD  HPI/Subjective: Patient states that his feet feel the same with pain and numbness and tingling. Patient states that he is having some chest congestion and today and feels tight in the chest. Some cough and wheeze. With the increase in Lasix is not urinating as much.  Objective: Filed Vitals:   09/04/14 1111  BP: 106/62  Pulse: 104  Temp: 98 F (36.7 C)  Resp: 20    Intake/Output Summary (Last 24 hours) at 09/04/14 1138 Last data filed at 09/04/14 0937  Gross per 24 hour  Intake    720 ml  Output    800 ml  Net    -80 ml   Filed Weights   09/02/14 0504 09/03/14 0445 09/04/14 1130  Weight: 125.828 kg (277 lb 6.4 oz) 127.597 kg (281 lb 4.8 oz) 129.048 kg (284 lb 8 oz)    ROS: Review of Systems  Constitutional: Negative for fever and chills.  Eyes: Negative for blurred vision.  Respiratory: Positive for shortness of breath and wheezing. Negative for cough.   Cardiovascular: Positive for chest pain.  Gastrointestinal: Positive for heartburn. Negative for nausea, vomiting, abdominal pain, diarrhea and constipation.  Genitourinary: Negative for dysuria.  Musculoskeletal: Negative for joint pain.  Neurological: Negative for dizziness and headaches.   Exam: Physical Exam  HENT:  Nose: No mucosal edema.  Mouth/Throat: No oropharyngeal exudate or posterior oropharyngeal edema.  Eyes: Conjunctivae, EOM and lids are normal. Pupils are equal, round, and reactive to light.  Neck: No JVD present. Carotid bruit is not present. No edema present. No thyroid mass and no thyromegaly present.  Cardiovascular: S1 normal and S2 normal.  Tachycardia present.  Exam reveals no gallop.   Murmur heard.  Systolic murmur is present with a grade of 3/6  Pulses:      Dorsalis pedis pulses are  1+ on the right side, and 1+ on the left side.  Respiratory: No respiratory distress. He has decreased breath sounds in the right lower field and the left lower field. He has no wheezes. He has rhonchi in the right lower field and the left lower field. He has no rales.  GI: Soft. Bowel sounds are normal. There is no tenderness.  Musculoskeletal:       Right ankle: He exhibits swelling.       Left ankle: He exhibits swelling.  Lymphadenopathy:    He has no cervical adenopathy.  Neurological: He is alert. No cranial nerve deficit.  Skin: Skin is warm. No rash noted. Nails show no clubbing.  Psychiatric: He has a normal mood and affect.   skin exam continued: Chronic lower extremity discoloration on the feet bilaterally.  Data Reviewed: Basic Metabolic Panel:  Recent Labs Lab 08/31/14 1106 08/31/14 2015 09/01/14 0248 09/02/14 0446 09/03/14 0452 09/04/14 0402  NA 144  --  145 145 144 142  K 3.1*  --  3.2* 3.2* 4.1 5.2*  CL 105  --  107 105 106 105  CO2 30  --  30 32 30 31  GLUCOSE 133*  --  150* 117* 116* 130*  BUN 24*  --  25* 24* 31* 41*  CREATININE 1.81* 1.81* 1.79* 1.68* 2.01* 2.28*  CALCIUM 8.6*  --  8.4* 8.2* 8.3* 8.5*  MG  --   --   --   --   --  2.2   CBC:  Recent Labs Lab 08/31/14 1106 08/31/14 2015 09/02/14 0446  WBC 7.7 6.9 6.9  HGB 14.1 14.3 14.7  HCT 44.6 44.2 45.6  MCV 94.6 95.4 95.9  PLT 256 230 240   Cardiac Enzymes:  Recent Labs Lab 08/31/14 1106 08/31/14 2015 09/01/14 0248 09/01/14 0756  TROPONINI 0.05* 0.03 0.05* 0.03   BNP (last 3 results)  Recent Labs  08/31/14 1104  BNP 528.0*    Scheduled Meds: . aspirin EC  81 mg Oral Daily  . budesonide (PULMICORT) nebulizer solution  0.25 mg Nebulization BID  . carvedilol  25 mg Oral BID WC  . colchicine  0.6 mg Oral Daily  . docusate sodium  100 mg Oral BID  . gabapentin  200 mg Oral BID  . heparin  5,000 Units Subcutaneous 3 times per day  . Ipratropium-Albuterol  1 puff Inhalation Q6H   . lisinopril  20 mg Oral Daily  . methylPREDNISolone (SOLU-MEDROL) injection  40 mg Intravenous Q12H  . pravastatin  20 mg Oral Daily  . sodium chloride  3 mL Intravenous Q12H  . tiotropium  18 mcg Inhalation Daily   Continuous Infusions:   Assessment/Plan: Active Problems:   CHF (congestive heart failure)   1. Acute on chronic systolic congestive heart failure. Last EF 15% back in February 2016. I will increase dose of Coreg to 25 mg twice a day, continue lisinopril. Patient received 100 mg IV Lasix this morning at 6 AM. I will hold Lasix for the rest of the day and check a creatinine in the a.m. 2. Chronic respiratory failure with hypoxia on oxygen continuous here in the hospital and at home 3 L. Continue budesonide nebulizers and add low-dose Solu-Medrol. 3. Likely sleep apnea. Patient desaturates when he sleeps. I will try to order BiPAP at night. 4. Hypokalemia now over replaced and hyperkalemic. Hold potassium supplementation 5. Neuropathy unspecified- continue low-dose gabapentin. 6. Chronic kidney disease stage III- with creatinine increasing with diuresis will hold diuresis for the rest of the day and check creatinine in the a.m. 7. Hyperlipidemia unspecified- continue pravastatin.  Code Status:     Code Status Orders        Start     Ordered   08/31/14 1937  Full code   Continuous     08/31/14 1936     Disposition Plan: Home with home health  Time spent: 20 minutes, case discussed with Management and social work team and also nursing team.  Alford HighlandWIETING, Jamell Laymon  [facility]

## 2014-09-04 NOTE — Clinical Social Work Note (Signed)
Pt refused SNF placement.  Pt is oriented.  CSW signing off.

## 2014-09-04 NOTE — Discharge Planning (Signed)
Patient has no showed for 2 previous appointments at the outpatient [facility]. Have made another initial appointment on September 21, 2014 at 11:00am.

## 2014-09-05 LAB — BASIC METABOLIC PANEL
Anion gap: 9 (ref 5–15)
BUN: 56 mg/dL — ABNORMAL HIGH (ref 6–20)
CO2: 27 mmol/L (ref 22–32)
CREATININE: 2.18 mg/dL — AB (ref 0.61–1.24)
Calcium: 8.8 mg/dL — ABNORMAL LOW (ref 8.9–10.3)
Chloride: 103 mmol/L (ref 101–111)
GFR, EST AFRICAN AMERICAN: 35 mL/min — AB (ref >60)
GFR, EST NON AFRICAN AMERICAN: 30 mL/min — AB (ref >60)
Glucose, Bld: 155 mg/dL — ABNORMAL HIGH (ref 65–99)
Potassium: 5.1 mmol/L (ref 3.5–5.1)
Sodium: 139 mmol/L (ref 135–145)

## 2014-09-05 LAB — PLATELET COUNT: Platelets: 242 10*3/uL (ref 150–440)

## 2014-09-05 MED ORDER — FUROSEMIDE 10 MG/ML IJ SOLN
100.0000 mg | Freq: Once | INTRAVENOUS | Status: AC
  Administered 2014-09-05: 100 mg via INTRAVENOUS
  Filled 2014-09-05: qty 10

## 2014-09-05 NOTE — Care Management Note (Signed)
Case Management Note  Patient Details  Name: George Hopkins MRN: [id number] Date of Birth: 1950/06/08  Subjective/Objective:    Pt's O2 tank empty and he states he has no way to obtain another tank from his home. TC to [facility], [facility] will [facility] at time of discharge. Pt updated.                 Action/Plan:   Expected Discharge Date:                  Expected Discharge Plan:     In-House Referral:     Discharge planning Services     Post Acute Care Choice:    Choice offered to:     DME Arranged:    DME Agency:     HH Arranged:    HH Agency:     Status of Service:     Medicare Important Message Given:  Yes Date Medicare IM Given:  09/04/14 Medicare IM give by:  Sherlon HandingNann Greeene Date Additional Medicare IM Given:    Additional Medicare Important Message give by:     If discussed at Long Length of Stay Meetings, dates discussed:    Additional Comments:  Marily MemosLisa M Finleigh Cheong, RN 09/05/2014, 2:50 PM

## 2014-09-05 NOTE — Progress Notes (Signed)
Patient ID: Toya SmothersFrancis N Ortego, male   DOB: 02/27/1951, 64 y.o.   MRN: [id number] [facility] PRO[facility]ESS NOTE  Toya SmothersFrancis N Noren JXB:[id number] DOB: 02/27/1951 DOA: 08/31/2014 PCP: Kathlee NationsEASON,PAUL, MD  HPI/Subjective: Patient still has a funny feeling in his epigastric lower chest area. We tried medications for acid reflux yesterday. Still with some shortness of breath and some cough greenish phlegm. Still swollen on the lower extremities.  Objective: Filed Vitals:   09/05/14 1118  BP: 103/70  Pulse: 106  Temp: 98.3 F (36.8 C)  Resp: 20    Intake/Output Summary (Last 24 hours) at 09/05/14 1139 Last data filed at 09/05/14 0824  Gross per 24 hour  Intake    840 ml  Output    675 ml  Net    165 ml   Filed Weights   09/03/14 0445 09/04/14 1130 09/05/14 0414  Weight: 127.597 kg (281 lb 4.8 oz) 129.048 kg (284 lb 8 oz) 127.143 kg (280 lb 4.8 oz)    ROS: Review of Systems  Constitutional: Negative for fever and chills.  Eyes: Negative for blurred vision.  Respiratory: Positive for cough, sputum production and shortness of breath.   Cardiovascular: Negative for chest pain.  Gastrointestinal: Positive for heartburn. Negative for nausea, vomiting, abdominal pain, diarrhea and constipation.  Genitourinary: Negative for dysuria.  Musculoskeletal: Positive for joint pain.  Neurological: Negative for dizziness and headaches.   Exam: Physical Exam  HENT:  Nose: No mucosal edema.  Mouth/Throat: No oropharyngeal exudate or posterior oropharyngeal edema.  Eyes: Conjunctivae, EOM and lids are normal. Pupils are equal, round, and reactive to light.  Neck: No JVD present. Carotid bruit is not present. No edema present. No thyroid mass and no thyromegaly present.  Cardiovascular: S1 normal and S2 normal.  Tachycardia present.  Exam reveals no gallop.   Murmur heard.  Systolic murmur is present with a grade of 3/6  Pulses:      Dorsalis pedis pulses are 1+ on the right side, and 1+  on the left side.  Respiratory: No respiratory distress. He has decreased breath sounds in the right lower field and the left lower field. He has no wheezes. He has no rhonchi. He has no rales.  GI: Soft. Bowel sounds are normal. There is no tenderness.  Musculoskeletal:       Right ankle: He exhibits swelling.       Left ankle: He exhibits swelling.  Lymphadenopathy:    He has no cervical adenopathy.  Neurological: He is alert. No cranial nerve deficit.  Skin: Skin is warm. No rash noted. Nails show no clubbing.  Psychiatric: He has a normal mood and affect.   skin exam continued: Chronic lower extremity discoloration on the feet bilaterally.  Data Reviewed: Basic Metabolic Panel:  Recent Labs Lab 09/01/14 0248 09/02/14 0446 09/03/14 0452 09/04/14 0402 09/05/14 0451  NA 145 145 144 142 139  K 3.2* 3.2* 4.1 5.2* 5.1  CL 107 105 106 105 103  CO2 30 32 30 31 27   GLUCOSE 150* 117* 116* 130* 155*  BUN 25* 24* 31* 41* 56*  CREATININE 1.79* 1.68* 2.01* 2.28* 2.18*  CALCIUM 8.4* 8.2* 8.3* 8.5* 8.8*  MG  --   --   --  2.2  --    CBC:  Recent Labs Lab 08/31/14 1106 08/31/14 2015 09/02/14 0446 09/05/14 0451  WBC 7.7 6.9 6.9  --   HGB 14.1 14.3 14.7  --   HCT 44.6 44.2 45.6  --   MCV  94.6 95.4 95.9  --   PLT 256 230 240 242   Cardiac Enzymes:  Recent Labs Lab 08/31/14 1106 08/31/14 2015 09/01/14 0248 09/01/14 0756  TROPONINI 0.05* 0.03 0.05* 0.03   BNP (last 3 results)  Recent Labs  08/31/14 1104  BNP 528.0*    Scheduled Meds: . aspirin EC  81 mg Oral Daily  . budesonide (PULMICORT) nebulizer solution  0.25 mg Nebulization BID  . carvedilol  25 mg Oral BID WC  . colchicine  0.6 mg Oral Daily  . docusate sodium  100 mg Oral BID  . famotidine  20 mg Oral BID  . furosemide  100 mg Intravenous Once  . gabapentin  200 mg Oral BID  . heparin  5,000 Units Subcutaneous 3 times per day  . Ipratropium-Albuterol  1 puff Inhalation Q6H  . lisinopril  20 mg Oral  Daily  . methylPREDNISolone (SOLU-MEDROL) injection  40 mg Intravenous Q12H  . pravastatin  20 mg Oral Daily  . sodium chloride  3 mL Intravenous Q12H  . tiotropium  18 mcg Inhalation Daily   Continuous Infusions:   Assessment/Plan: Active Problems:   CHF (congestive heart failure)   1. Acute on chronic systolic congestive heart failure. Last EF 15% back in February 2016. Continue Coreg to 25 mg twice a day, continue lisinopril. I will give 1 dose of 100 mg IV Lasix today. Check creatinine in a.m. This is given a be a tough balance with his fluid overload and creatinine that has increased with diuresis. Patient's heart rate continues to be a little bit above 100 despite doubling Coreg dose twice. 2. Chronic respiratory failure with hypoxia on oxygen continuous here in the hospital and at home 3 L. Continue budesonide nebulizers and add low-dose Solu-Medrol. 3. Likely sleep apnea. Patient desaturates when he sleeps. I will try to order BiPAP at night. 4. Hypokalemia now over replaced and hyperkalemic. Hold potassium supplementation 5. Neuropathy unspecified- continue low-dose gabapentin. 6. Chronic kidney disease stage III- with creatinine increasing with diuresis, this is given a be a tough fluid balance. 7. Hyperlipidemia unspecified- continue pravastatin.  Code Status:     Code Status Orders        Start     Ordered   08/31/14 1937  Full code   Continuous     08/31/14 1936     Disposition Plan: Home with home health  Time spent: 20 minutes.  Alford HighlandWIETING, Eyonna Sandstrom  [facility]

## 2014-09-05 NOTE — Clinical Documentation Improvement (Signed)
pt has known CKD, stage 3.  BUN started on 5/12 at 24; on 5/15 went to 31; 5/16 = 41; 5/17 = 56.  Creatinine started at 1.81 on admission; went to 5/15 to 2.01; 5/16 = 2.28; 5/17 = 2.18.  Would you please help clarify medical condition associated with related clinical findings?  Acute renal failure, unspecified Acute renal failure with ATN Other clinical explanation of clinical indicators Un able to determine at this time.  Thank You, Harrie Jeanseborah L Damoni Causby ,RN Clinical Documentation Specialist:    [facility]- Health Information Management [phone] Cell - [phone]

## 2014-09-06 LAB — BASIC METABOLIC PANEL
Anion gap: 8 (ref 5–15)
BUN: 61 mg/dL — ABNORMAL HIGH (ref 6–20)
CHLORIDE: 105 mmol/L (ref 101–111)
CO2: 30 mmol/L (ref 22–32)
Calcium: 8.7 mg/dL — ABNORMAL LOW (ref 8.9–10.3)
Creatinine, Ser: 2.28 mg/dL — ABNORMAL HIGH (ref 0.61–1.24)
GFR calc non Af Amer: 28 mL/min — ABNORMAL LOW (ref >60)
GFR, EST AFRICAN AMERICAN: 33 mL/min — AB (ref >60)
Glucose, Bld: 170 mg/dL — ABNORMAL HIGH (ref 65–99)
Potassium: 4.7 mmol/L (ref 3.5–5.1)
Sodium: 143 mmol/L (ref 135–145)

## 2014-09-06 MED ORDER — CARVEDILOL 25 MG PO TABS
37.5000 mg | ORAL_TABLET | Freq: Two times a day (BID) | ORAL | Status: DC
  Administered 2014-09-06 – 2014-09-08 (×4): 37.5 mg via ORAL
  Filled 2014-09-06 (×4): qty 1

## 2014-09-06 MED ORDER — PREDNISONE 20 MG PO TABS
40.0000 mg | ORAL_TABLET | Freq: Every day | ORAL | Status: DC
  Administered 2014-09-06 – 2014-09-07 (×2): 40 mg via ORAL
  Filled 2014-09-06 (×2): qty 2

## 2014-09-06 MED ORDER — BUDESONIDE 0.5 MG/2ML IN SUSP
0.5000 mg | Freq: Two times a day (BID) | RESPIRATORY_TRACT | Status: DC
  Administered 2014-09-06 – 2014-09-08 (×4): 0.5 mg via RESPIRATORY_TRACT
  Filled 2014-09-06 (×4): qty 2

## 2014-09-06 MED ORDER — BUDESONIDE 0.25 MG/2ML IN SUSP
0.2500 mg | Freq: Two times a day (BID) | RESPIRATORY_TRACT | Status: DC
Start: 2014-09-06 — End: 2014-09-06
  Filled 2014-09-06 (×3): qty 2

## 2014-09-06 NOTE — Progress Notes (Signed)
Patient ID: George Hopkins, male   DOB: Oct 17, 1950, 64 y.o.   MRN: [id number] [facility] PROGRESS [facility]  George SmothersFrancis N Hopkins MWN:[id number] DOB: Oct 17, 1950 DOA: 08/31/2014 PCP: Kathlee NationsEASON,PAUL, MD  HPI/Subjective: Patient still has a funny feeling in his epigastric lower chest area. We tried medications for acid reflux yesterday. Still with some shortness of breath and some cough greenish phlegm. Still swollen on the lower extremities.  Objective: Filed Vitals:   09/06/14 1050  BP: 101/66  Pulse: 108  Temp: 97.7 F (36.5 C)  Resp: 19    Intake/Output Summary (Last 24 hours) at 09/06/14 1104 Last data filed at 09/06/14 0813  Gross per 24 hour  Intake    360 ml  Output   1550 ml  Net  -1190 ml   Filed Weights   09/04/14 1130 09/05/14 0414 09/06/14 0500  Weight: 129.048 kg (284 lb 8 oz) 127.143 kg (280 lb 4.8 oz) 132.904 kg (293 lb)    ROS: Review of Systems  Constitutional: Negative for fever and chills.  Eyes: Negative for blurred vision.  Respiratory: Positive for shortness of breath. Negative for cough.   Cardiovascular: Negative for chest pain.  Gastrointestinal: Negative for nausea, vomiting, abdominal pain, diarrhea and constipation.  Genitourinary: Negative for dysuria.  Musculoskeletal: Positive for joint pain.  Neurological: Negative for dizziness and headaches.   Exam: Physical Exam  HENT:  Nose: No mucosal edema.  Mouth/Throat: No oropharyngeal exudate or posterior oropharyngeal edema.  Eyes: Conjunctivae, EOM and lids are normal. Pupils are equal, round, and reactive to light.  Neck: No JVD present. Carotid bruit is not present. No edema present. No thyroid mass and no thyromegaly present.  Cardiovascular: S1 normal and S2 normal.  Tachycardia present.  Exam reveals no gallop.   Murmur heard.  Systolic murmur is present with a grade of 3/6  Pulses:      Dorsalis pedis pulses are 1+ on the right side, and 1+ on the left side.  Respiratory: No  respiratory distress. He has decreased breath sounds in the right lower field and the left lower field. He has no wheezes. He has no rhonchi. He has no rales.  GI: Soft. Bowel sounds are normal. There is no tenderness.  Musculoskeletal:       Right ankle: He exhibits swelling.       Left ankle: He exhibits swelling.  Lymphadenopathy:    He has no cervical adenopathy.  Neurological: He is alert. No cranial nerve deficit.  Skin: Skin is warm. No rash noted. Nails show no clubbing.  Psychiatric: He has a normal mood and affect.   skin exam continued: Chronic lower extremity discoloration on the feet bilaterally.  Data Reviewed: Basic Metabolic Panel:  Recent Labs Lab 09/02/14 0446 09/03/14 0452 09/04/14 0402 09/05/14 0451 09/06/14 0442  NA 145 144 142 139 143  K 3.2* 4.1 5.2* 5.1 4.7  CL 105 106 105 103 105  CO2 32 30 31 27 30   GLUCOSE 117* 116* 130* 155* 170*  BUN 24* 31* 41* 56* 61*  CREATININE 1.68* 2.01* 2.28* 2.18* 2.28*  CALCIUM 8.2* 8.3* 8.5* 8.8* 8.7*  MG  --   --  2.2  --   --    CBC:  Recent Labs Lab 08/31/14 1106 08/31/14 2015 09/02/14 0446 09/05/14 0451  WBC 7.7 6.9 6.9  --   HGB 14.1 14.3 14.7  --   HCT 44.6 44.2 45.6  --   MCV 94.6 95.4 95.9  --   PLT 256  230 240 242   Cardiac Enzymes:  Recent Labs Lab 08/31/14 1106 08/31/14 2015 09/01/14 0248 09/01/14 0756  TROPONINI 0.05* 0.03 0.05* 0.03   BNP (last 3 results)  Recent Labs  08/31/14 1104  BNP 528.0*    Scheduled Meds: . aspirin EC  81 mg Oral Daily  . budesonide (PULMICORT) nebulizer solution  0.5 mg Nebulization BID  . carvedilol  25 mg Oral BID WC  . colchicine  0.6 mg Oral Daily  . docusate sodium  100 mg Oral BID  . famotidine  20 mg Oral BID  . gabapentin  200 mg Oral BID  . heparin  5,000 Units Subcutaneous 3 times per day  . Ipratropium-Albuterol  1 puff Inhalation Q6H  . lisinopril  20 mg Oral Daily  . methylPREDNISolone (SOLU-MEDROL) injection  40 mg Intravenous Q12H  .  pravastatin  20 mg Oral Daily  . sodium chloride  3 mL Intravenous Q12H  . tiotropium  18 mcg Inhalation Daily   Continuous Infusions:   Assessment/Plan: Active Problems:   CHF (congestive heart failure)   1. Acute on chronic systolic congestive heart failure. Last EF 15% back in February 2016. Increase Coreg to 37.5 mg twice a day, continue lisinopril. Hold Lasix today Check creatinine in a.m. This is given a be a tough balance with his fluid overload and creatinine that has increased with diuresis. 2. Chronic respiratory failure with hypoxia on oxygen continuous here in the hospital and at home 3 L. Continue budesonide nebulizers, change Solu-Medrol to prednisone taper. 3. Acute renal failure on chronic kidney disease stage III. I will hold diuresis today and recheck BMP in the a.m. he is going to have a difficult fluid balance secondary to anasarca. Lungs sound better today. 4. Likely sleep apnea. Patient desaturates when he sleeps. I will try to order BiPAP at night. 5. Hypokalemia now over replaced and hyperkalemic. Hold potassium supplementation 6. Neuropathy unspecified- continue low-dose gabapentin. 7. Hyperlipidemia unspecified- continue pravastatin.  Code Status:     Code Status Orders        Start     Ordered   08/31/14 1937  Full code   Continuous     08/31/14 1936     Disposition Plan: Home with home health  Time spent: 20 minutes.  Alford HighlandWIETING, Dredyn Gubbels  [facility]

## 2014-09-06 NOTE — Care Management (Signed)
Patient's renal status has continued to decline.  IV diuretics and steroids have been discontinued.  Attending declines need for nephrology consult.  Anticipate discharge within next 48 hours.  Spoke with patient regarding home health nurse and this time patient did not decline- he did not agree but did not verbalize decline.  York SpanielSaid he would think about  It.  Attending will speak with patient about home health 5/19.

## 2014-09-07 LAB — BASIC METABOLIC PANEL
Anion gap: 6 (ref 5–15)
BUN: 61 mg/dL — AB (ref 6–20)
CHLORIDE: 108 mmol/L (ref 101–111)
CO2: 29 mmol/L (ref 22–32)
CREATININE: 2.08 mg/dL — AB (ref 0.61–1.24)
Calcium: 8.9 mg/dL (ref 8.9–10.3)
GFR calc non Af Amer: 32 mL/min — ABNORMAL LOW (ref >60)
GFR, EST AFRICAN AMERICAN: 37 mL/min — AB (ref >60)
Glucose, Bld: 154 mg/dL — ABNORMAL HIGH (ref 65–99)
POTASSIUM: 4.8 mmol/L (ref 3.5–5.1)
Sodium: 143 mmol/L (ref 135–145)

## 2014-09-07 MED ORDER — ENOXAPARIN SODIUM 40 MG/0.4ML ~~LOC~~ SOLN
40.0000 mg | SUBCUTANEOUS | Status: DC
  Administered 2014-09-07: 40 mg via SUBCUTANEOUS
  Filled 2014-09-07: qty 0.4

## 2014-09-07 MED ORDER — ALBUTEROL SULFATE (2.5 MG/3ML) 0.083% IN NEBU
2.5000 mg | INHALATION_SOLUTION | Freq: Four times a day (QID) | RESPIRATORY_TRACT | Status: DC
  Administered 2014-09-07 – 2014-09-08 (×4): 2.5 mg via RESPIRATORY_TRACT
  Filled 2014-09-07 (×4): qty 3

## 2014-09-07 MED ORDER — PREDNISONE 10 MG PO TABS
30.0000 mg | ORAL_TABLET | Freq: Every day | ORAL | Status: DC
  Administered 2014-09-08: 30 mg via ORAL
  Filled 2014-09-07: qty 1

## 2014-09-07 MED ORDER — GABAPENTIN 300 MG PO CAPS
300.0000 mg | ORAL_CAPSULE | Freq: Two times a day (BID) | ORAL | Status: DC
  Administered 2014-09-07 – 2014-09-08 (×2): 300 mg via ORAL
  Filled 2014-09-07 (×2): qty 1

## 2014-09-07 NOTE — Care Management (Signed)
Patient has questions about whether he is at the end stages of his disease process.  Says he does not know what services to accept without fully understanding the extent of his current disease processes.  Discussed palliative care consult and patient would like to speak with a team member.  Will obtain order in the AM.

## 2014-09-07 NOTE — Progress Notes (Signed)
Pt alert and oriented x4, no complaints of pain or discomfort.  Bed in low position, call bell within reach.  Bed alarms on and functioning.  Assessment done and charted.  Will continue to monitor and do hourly rounding throughout the shift 

## 2014-09-07 NOTE — Progress Notes (Signed)
Patient ID: George Hopkins, male   DOB: May 06, 1950, 64 y.o.   MRN: [id number] [facility] PROGRESS NOTE  George SmothersFrancis N Hopkins YQM:[id number] DOB: May 06, 1950 DOA: 08/31/2014 PCP: Kathlee NationsEASON,PAUL, MD  HPI/Subjective: Patient very nervous about going home. Patient states that he is short of breath because he didn't get his inhaler when he normally takes it. States that his breath is cutting off when he stopped talking. Still swollen.  Objective: Filed Vitals:   09/07/14 1107  BP: 103/75  Pulse: 109  Temp: 97.7 F (36.5 C)  Resp: 20    Intake/Output Summary (Last 24 hours) at 09/07/14 1123 Last data filed at 09/07/14 0830  Gross per 24 hour  Intake   1080 ml  Output    175 ml  Net    905 ml   Filed Weights   09/06/14 0500 09/06/14 1100 09/07/14 0524  Weight: 132.904 kg (293 lb) 130.182 kg (287 lb) 128.005 kg (282 lb 3.2 oz)    ROS: Review of Systems  Constitutional: Negative for fever and chills.  Eyes: Negative for blurred vision.  Respiratory: Positive for shortness of breath. Negative for cough.   Cardiovascular: Negative for chest pain.  Gastrointestinal: Positive for heartburn. Negative for nausea, vomiting, abdominal pain, diarrhea and constipation.  Genitourinary: Negative for dysuria.  Musculoskeletal: Negative for joint pain.  Neurological: Positive for tingling. Negative for dizziness and headaches.   Exam: Physical Exam  HENT:  Nose: No mucosal edema.  Mouth/Throat: No oropharyngeal exudate or posterior oropharyngeal edema.  Eyes: Conjunctivae, EOM and lids are normal. Pupils are equal, round, and reactive to light.  Neck: No JVD present. Carotid bruit is not present. No edema present. No thyroid mass and no thyromegaly present.  Cardiovascular: S1 normal and S2 normal.  Tachycardia present.  Exam reveals no gallop.   Murmur heard.  Systolic murmur is present with a grade of 3/6  Pulses:      Dorsalis pedis pulses are 1+ on the right side, and 1+ on  the left side.  Respiratory: No respiratory distress. He has decreased breath sounds in the right lower field and the left lower field. He has no wheezes. He has no rhonchi. He has no rales.  GI: Soft. Bowel sounds are normal. There is no tenderness.  Musculoskeletal:       Right ankle: He exhibits swelling.       Left ankle: He exhibits swelling.  Lymphadenopathy:    He has no cervical adenopathy.  Neurological: He is alert. No cranial nerve deficit.  Skin: Skin is warm. No rash noted. Nails show no clubbing.  Psychiatric: He has a normal mood and affect.   skin exam continued: Chronic lower extremity discoloration on the feet bilaterally.  Data Reviewed: Basic Metabolic Panel:  Recent Labs Lab 09/03/14 0452 09/04/14 0402 09/05/14 0451 09/06/14 0442 09/07/14 0425  NA 144 142 139 143 143  K 4.1 5.2* 5.1 4.7 4.8  CL 106 105 103 105 108  CO2 30 31 27 30 29   GLUCOSE 116* 130* 155* 170* 154*  BUN 31* 41* 56* 61* 61*  CREATININE 2.01* 2.28* 2.18* 2.28* 2.08*  CALCIUM 8.3* 8.5* 8.8* 8.7* 8.9  MG  --  2.2  --   --   --    CBC:  Recent Labs Lab 08/31/14 2015 09/02/14 0446 09/05/14 0451  WBC 6.9 6.9  --   HGB 14.3 14.7  --   HCT 44.2 45.6  --   MCV 95.4 95.9  --  PLT 230 240 242   Cardiac Enzymes:  Recent Labs Lab 08/31/14 2015 09/01/14 0248 09/01/14 0756  TROPONINI 0.03 0.05* 0.03   BNP (last 3 results)  Recent Labs  08/31/14 1104  BNP 528.0*    Scheduled Meds: . aspirin EC  81 mg Oral Daily  . budesonide (PULMICORT) nebulizer solution  0.5 mg Nebulization BID  . carvedilol  37.5 mg Oral BID WC  . colchicine  0.6 mg Oral Daily  . docusate sodium  100 mg Oral BID  . famotidine  20 mg Oral BID  . gabapentin  300 mg Oral BID  . heparin  5,000 Units Subcutaneous 3 times per day  . Ipratropium-Albuterol  1 puff Inhalation Q6H  . lisinopril  20 mg Oral Daily  . pravastatin  20 mg Oral Daily  . [START ON 09/08/2014] predniSONE  30 mg Oral Q breakfast  .  sodium chloride  3 mL Intravenous Q12H  . tiotropium  18 mcg Inhalation Daily   Continuous Infusions:   Assessment/Plan: Active Problems:   CHF (congestive heart failure)   1. Acute on chronic systolic congestive heart failure. Last EF 15% back in February 2016. On Coreg to 37.5 mg twice a day, continue lisinopril. Hold Lasix today Check creatinine in a.m. This is given a be a tough balance with his fluid overload and creatinine that has increased with diuresis. Patient is still losing weight despite holding diuretics. 2. Chronic respiratory failure with hypoxia on oxygen continuous here in the hospital and at home 3 L. Continue budesonide nebulizers, prednisone taper. 3. Acute renal failure on chronic kidney disease stage III. I will hold diuresis today and recheck BMP in the a.m. 4. Likely sleep apnea. Patient desaturates when he sleeps. 5. Hypokalemia now over replaced and hyperkalemic. Hold potassium supplementation. 6. Neuropathy unspecified- increase gabapentin to 300 mg twice a day. 7. Hyperlipidemia unspecified- continue pravastatin.  Code Status:     Code Status Orders        Start     Ordered   08/31/14 1937  Full code   Continuous     08/31/14 1936     Disposition Plan: Home with home health, possibly tomorrow. Home health in CHF clinic will potentially help keep him out of the hospital.  Time spent: 20 minutes.  Alford HighlandWIETING, George Hopkins  [facility]

## 2014-09-07 NOTE — Progress Notes (Signed)
Pt verbalizes excitement about going home in the am. Pt tolerated meals well.  No complaints of pain or discomfort.  Still breathing heavily and has dyspnea with exertion.  Will continue to monitor

## 2014-09-08 LAB — BASIC METABOLIC PANEL
ANION GAP: 5 (ref 5–15)
BUN: 55 mg/dL — ABNORMAL HIGH (ref 6–20)
CALCIUM: 9.1 mg/dL (ref 8.9–10.3)
CO2: 30 mmol/L (ref 22–32)
Chloride: 109 mmol/L (ref 101–111)
Creatinine, Ser: 1.74 mg/dL — ABNORMAL HIGH (ref 0.61–1.24)
GFR, EST AFRICAN AMERICAN: 45 mL/min — AB (ref >60)
GFR, EST NON AFRICAN AMERICAN: 39 mL/min — AB (ref >60)
Glucose, Bld: 134 mg/dL — ABNORMAL HIGH (ref 65–99)
Potassium: 4.5 mmol/L (ref 3.5–5.1)
SODIUM: 144 mmol/L (ref 135–145)

## 2014-09-08 LAB — PLATELET COUNT: Platelets: 278 10*3/uL (ref 150–440)

## 2014-09-08 MED ORDER — CARVEDILOL 12.5 MG PO TABS: 37.5000 mg | ORAL_TABLET | Freq: Two times a day (BID) | ORAL | Status: DC

## 2014-09-08 MED ORDER — FAMOTIDINE 20 MG PO TABS: 20.0000 mg | ORAL_TABLET | Freq: Two times a day (BID) | ORAL | Status: DC

## 2014-09-08 MED ORDER — GABAPENTIN 300 MG PO CAPS: 300.0000 mg | ORAL_CAPSULE | Freq: Two times a day (BID) | ORAL | Status: AC

## 2014-09-08 MED ORDER — FLUTICASONE-SALMETEROL 115-21 MCG/ACT IN AERO: 2.0000 | INHALATION_SPRAY | Freq: Two times a day (BID) | RESPIRATORY_TRACT | Status: DC

## 2014-09-08 MED ORDER — PREDNISONE 5 MG PO TABS: 5.0000 mg | ORAL_TABLET | Freq: Every day | ORAL | Status: DC

## 2014-09-08 NOTE — Discharge Instructions (Signed)

## 2014-09-08 NOTE — Care Management (Signed)
Patient for discharge home today.  Attending states that patient's symptoms may have been more related to his COPD rather than CHF.  Patient continues to decline CPAP from which patient would benefit but does not want to wear.  He is agreeable to home health.  Patient does have his portable 02 tank for transport home.  No agency preference for home health.  Referral to [facility] for [facility] PT OT and aide.  Asked that patient be seen within 24 hours of d/c.  Per Kathlene NovemberMike with [facility]- this can be accomodated

## 2014-09-08 NOTE — Discharge Summary (Signed)
[facility] [place] [facility]   PATIENT NAME: George RamFrancis Nichelson    MR#:  [id number]  DATE OF BIRTH:  04/21/1951  DATE OF ADMISSION:  08/31/2014 ADMITTING PHYSICIAN: Adrian SaranSital Mody, MD  DATE OF DISCHARGE: 09/08/2014  PRIMARY CARE PHYSICIAN: Kathlee NationsEASON,PAUL, MD    ADMISSION DIAGNOSIS:  Acute exacerbation of CHF (congestive heart failure) [I50.9]  DISCHARGE DIAGNOSIS:  Active Problems:   CHF (congestive heart failure)   SECONDARY DIAGNOSIS:   Past Medical History  Diagnosis Date  . Hypertension   . CHF (congestive heart failure)   . COPD (chronic obstructive pulmonary disease)   . Diverticulitis   . Irregular heart beat     HOSPITAL COURSE:   1. Acute systolic congestive heart failure exacerbation. Patient was diuresed with IV Lasix. The Lasix dose was increased to 100 mg IV twice a day. The patient ended up being over diuresed and Lasix was held for a few days. Creatinine improved back to baseline. Can restart torsemide as outpatient. Follow-up with Dr. Juliann Paresallwood as outpatient. The patient's heart rate has been elevated and Coreg was increased to 37.5 mg twice a day. The patient is on lisinopril. Can consider Aldactone as outpatient (I did not want to prescribe this secondary to worsening kidney function during the hospitalization). Sleep apnea likely exacerbating the issue. 2. COPD exacerbation and chronic respiratory failure: I think this was the major issue during the hospitalization. The patient was put on budesonide nebs and IV Solu-Medrol and switched over to a prednisone taper. Advair prescribed upon going home, he is already on Combivent and Spiriva. Lungs were clear upon discharge. Patient is on oxygen continuous via nasal cannula. 3. Acute renal failure on chronic kidney disease stage III; the patient was over diuresis with IV Lasix and her creatinine worsened to 2.2. Upon discharge is down to 1.74 which is close to the patient's baseline. His IV Lasix  was held for 2 days prior to discharge. Can go back on torsemide dose once a day upon discharge. 4. Morbid obesity, sleep apnea: The patient cannot tolerate CPAP or BiPAP. This is unfortunate because this is probably part of the issue with the heart failure. 5. Chronic foot pain, likely neuropathy; gabapentin started. 6. History of gout on colchicine. 7. Gastroesophageal reflux disease without esophagitis patient was started on Pepcid.  DISCHARGE CONDITIONS:   Fair  CONSULTS OBTAINED:  Treatment Team:  Alwyn Peawayne D Callwood, MD  DRUG ALLERGIES:   Allergies  Allergen Reactions  . Penicillins Swelling    DISCHARGE MEDICATIONS:   Current Discharge Medication List    START taking these medications   Details  famotidine (PEPCID) 20 MG tablet Take 1 tablet (20 mg total) by mouth 2 (two) times daily. Qty: 60 tablet, Refills: 0    fluticasone-salmeterol (ADVAIR HFA) 115-21 MCG/ACT inhaler Inhale 2 puffs into the lungs 2 (two) times daily. Qty: 1 Inhaler, Refills: 12    gabapentin (NEURONTIN) 300 MG capsule Take 1 capsule (300 mg total) by mouth 2 (two) times daily. Qty: 60 capsule, Refills: 0    predniSONE (DELTASONE) 5 MG tablet Take 1 tablet (5 mg total) by mouth daily with breakfast. 3 tabs day 1; 2 tabs day 2, 1 tab day 3,4. Qty: 7 tablet, Refills: 0      CONTINUE these medications which have CHANGED   Details  carvedilol (COREG) 12.5 MG tablet Take 3 tablets (37.5 mg total) by mouth 2 (two) times daily with a meal. Qty: 180 tablet, Refills: 0  CONTINUE these medications which have NOT CHANGED   Details  colchicine (COLCRYS) 0.6 MG tablet Take 0.6 mg by mouth daily.    docusate sodium (COLACE) 100 MG capsule Take 100 mg by mouth 2 (two) times daily.    Ipratropium-Albuterol (COMBIVENT) 20-100 MCG/ACT AERS respimat Inhale 1 puff into the lungs every 6 (six) hours.    lisinopril (PRINIVIL,ZESTRIL) 20 MG tablet Take 20 mg by mouth daily.    pravastatin (PRAVACHOL) 20  MG tablet Take 20 mg by mouth daily.    tiotropium (SPIRIVA) 18 MCG inhalation capsule Place 18 mcg into inhaler and inhale daily.    torsemide (DEMADEX) 100 MG tablet Take 100 mg by mouth daily.    OXYGEN-HELIUM IN Inhale into the lungs.      STOP taking these medications     amLODipine-benazepril (LOTREL) 5-20 MG per capsule      meloxicam (MOBIC) 15 MG tablet          DISCHARGE INSTRUCTIONS:   Follow-up with home health. Follow-up with Dr. Maryellen Pile 2 weeks. Follow-up with Dr. Juliann Pares 1 week.  If you experience worsening of your admission symptoms, develop shortness of breath, life threatening emergency, suicidal or homicidal thoughts you must seek medical attention immediately by calling 911 or calling your MD immediately  if symptoms less severe.  You Must read complete instructions/literature along with all the possible adverse reactions/side effects for all the Medicines you take and that have been prescribed to you. Take any new Medicines after you have completely understood and accept all the possible adverse reactions/side effects.   Please note  You were cared for by a hospitalist during your hospital stay. If you have any questions about your discharge medications or the care you received while you were in the hospital after you are discharged, you can call the unit and asked to speak with the hospitalist on call if the hospitalist that took care of you is not available. Once you are discharged, your primary care physician will handle any further medical issues. Please note that NO REFILLS for any discharge medications will be authorized once you are discharged, as it is imperative that you return to your primary care physician (or establish a relationship with a primary care physician if you do not have one) for your aftercare needs so that they can reassess your need for medications and monitor your lab values.    Today   CHIEF COMPLAINT:   Chief Complaint  Patient  presents with  . Leg Swelling    HISTORY OF PRESENT ILLNESS:  Jaegar Croft  is a 64 y.o. male with a known history of systolic congestive heart failure and COPD resented with shortness of breath.   VITAL SIGNS:  Blood pressure 112/76, pulse 108, temperature 98.2 F (36.8 C), temperature source Oral, resp. rate 20, height  (1.803 m), weight 128.549 kg (283 lb 6.4 oz), SpO2 96 %.  I/O:   Intake/Output Summary (Last 24 hours) at 09/08/14 0850 Last data filed at 09/07/14 1630  Gross per 24 hour  Intake    480 ml  Output      0 ml  Net    480 ml    PHYSICAL EXAMINATION:  GENERAL:  64 y.o.-year-old patient lying in the bed with no acute distress.  EYES: Pupils equal, round, reactive to light and accommodation. No scleral icterus. Extraocular muscles intact.  HEENT: Head atraumatic, normocephalic. Oropharynx and nasopharynx clear.  NECK:  Supple, no jugular venous distention. No thyroid enlargement, no  tenderness.  LUNGS: Normal breath sounds bilaterally, no wheezing, rales,rhonchi or crepitation. No use of accessory muscles of respiration.  CARDIOVASCULAR: S1, S2 normal  No murmurs, rubs, or gallops.  ABDOMEN: Soft, non-tender, non-distended. Bowel sounds present. No organomegaly or mass.  EXTREMITIES: 3+ edema, no cyanosis, or clubbing.  NEUROLOGIC: Cranial nerves II through XII are intact. Muscle strength 5/5 in all extremities. Sensation intact. Gait not checked.  PSYCHIATRIC: The patient is alert and oriented x 3.  SKIN: No obvious rash, lesion, or ulcer.   DATA REVIEW:   CBC  Recent Labs Lab 09/02/14 0446  09/08/14 0612  WBC 6.9  --   --   HGB 14.7  --   --   HCT 45.6  --   --   PLT 240  < > 278  < > = values in this interval not displayed.  Chemistries   Recent Labs Lab 09/04/14 0402  09/08/14 0612  NA 142  < > 144  K 5.2*  < > 4.5  CL 105  < > 109  CO2 31  < > 30  GLUCOSE 130*  < > 134*  BUN 41*  < > 55*  CREATININE 2.28*  < > 1.74*  CALCIUM  8.5*  < > 9.1  MG 2.2  --   --   < > = values in this interval not displayed.  Management plans discussed with the patient, and he is in agreement.  CODE STATUS:     Code Status Orders        Start     Ordered   08/31/14 1937  Full code   Continuous     08/31/14 1936      TOTAL TIME TAKING CARE OF THIS PATIENT: 40 minutes.    Alford HighlandWIETING, Erik Burkett M.D on 09/08/2014 at 8:50 AM  Between 7am to 6pm - Pager - [phone]  After 6pm go to [email or web address] - password [email or web address] [facility]  [facility]  Office  [phone]  CC: Primary care physician; Kathlee NationsEASON,PAUL, MD

## 2014-09-08 NOTE — Progress Notes (Signed)
Nutrition Follow-up  DOCUMENTATION CODES:     INTERVENTION:   (Meals and Snacks: Cater to patient preferences)  NUTRITION DIAGNOSIS:   (No nutrition concerns at this time)     GOAL:  Patient will meet greater than or equal to 90% of their needs    MONITOR:   (Energy intake, Electrolyte and renal Profile, Glucose Profile)  REASON FOR ASSESSMENT:  Other (Comment) (RD Follow Up)    ASSESSMENT:  Clinical Update:  Typical Food/ Fluid Intake: 100% of meals recorded per I/O Weight Changes: Weight trending up from admission Labs:Electrolyte and Renal Profile:    Recent Labs Lab 09/04/14 0402  09/06/14 0442 09/07/14 0425 09/08/14 0612  BUN 41*  < > 61* 61* 55*  CREATININE 2.28*  < > 2.28* 2.08* 1.74*  NA 142  < > 143 143 144  K 5.2*  < > 4.7 4.8 4.5  MG 2.2  --   --   --   --   < > = values in this interval not displayed.  Glucose- 134  Meds: Colace Physical Findings: n/a  Skin: WDL    Height:  Ht Readings from Last 1 Encounters:  08/31/14 5\' 11"  (1.803 m)    Weight:  Wt Readings from Last 1 Encounters:  09/08/14 283 lb 6.4 oz (128.549 kg)    Ideal Body Weight:     Wt Readings from Last 10 Encounters:  09/08/14 283 lb 6.4 oz (128.549 kg)  09/06/13 205 lb (92.987 kg)    BMI:  Body mass index is 39.54 kg/(m^2).   Diet Order:  Diet Heart Room service appropriate?: Yes; Fluid consistency:: Thin  EDUCATION NEEDS:  No education needs identified at this time   Intake/Output Summary (Last 24 hours) at 09/08/14 0941 Last data filed at 09/07/14 1630  Gross per 24 hour  Intake    480 ml  Output      0 ml  Net    480 ml    Last BM:  5/19  Joeseph Amorracey L. Gaines, RDN Pager: [phone] Office: [phone]  LOW Care Level

## 2014-09-08 NOTE — Progress Notes (Signed)
Discharge: Pt d/c from room via wheelchair,. Discharge instructions given to the patient .explained to pt that home health will be visiting him. No questions from pt, reintegrated to the pt to call or go to the ED for chest discomfort. Pt dressed in street clothes and left with discharge papers. IV d/ced, tele removed and no complaints of pain or discomfort.

## 2014-09-21 ENCOUNTER — Ambulatory Visit: Payer: Medicare Other | Admitting: Family

## 2015-01-02 ENCOUNTER — Emergency Department
Admission: EM | Admit: 2015-01-02 | Discharge: 2015-01-02 | Disposition: A | Discharge status: Home / Self Care | Payer: Medicare Other | Attending: Emergency Medicine | Admitting: Emergency Medicine

## 2015-01-02 ENCOUNTER — Emergency Department: Payer: Medicare Other

## 2015-01-02 DIAGNOSIS — Z87891 Personal history of nicotine dependence: Secondary | ICD-10-CM | POA: Insufficient documentation

## 2015-01-02 DIAGNOSIS — I10 Essential (primary) hypertension: Secondary | ICD-10-CM | POA: Diagnosis not present

## 2015-01-02 DIAGNOSIS — Z79899 Other long term (current) drug therapy: Secondary | ICD-10-CM | POA: Diagnosis not present

## 2015-01-02 DIAGNOSIS — Z880 Allergy status to penicillin: Secondary | ICD-10-CM | POA: Insufficient documentation

## 2015-01-02 DIAGNOSIS — M25561 Pain in right knee: Secondary | ICD-10-CM | POA: Diagnosis present

## 2015-01-02 DIAGNOSIS — Z7952 Long term (current) use of systemic steroids: Secondary | ICD-10-CM | POA: Diagnosis not present

## 2015-01-02 DIAGNOSIS — M10061 Idiopathic gout, right knee: Secondary | ICD-10-CM | POA: Insufficient documentation

## 2015-01-02 HISTORY — DX: Gout, unspecified: M10.9

## 2015-01-02 MED ORDER — HYDROCODONE-ACETAMINOPHEN 5-325 MG PO TABS: 1.0000 | ORAL_TABLET | ORAL | Status: DC | PRN

## 2015-01-02 MED ORDER — NAPROXEN 500 MG PO TABS: 500.0000 mg | ORAL_TABLET | Freq: Two times a day (BID) | ORAL | Status: DC

## 2015-01-02 MED ORDER — HYDROCODONE-ACETAMINOPHEN 5-325 MG PO TABS
2.0000 | ORAL_TABLET | Freq: Once | ORAL | Status: AC
  Administered 2015-01-02: 2 via ORAL
  Filled 2015-01-02: qty 2

## 2015-01-02 MED ORDER — KETOROLAC TROMETHAMINE 60 MG/2ML IM SOLN
30.0000 mg | Freq: Once | INTRAMUSCULAR | Status: AC
  Administered 2015-01-02: 30 mg via INTRAMUSCULAR
  Filled 2015-01-02: qty 2

## 2015-01-02 NOTE — Discharge Instructions (Signed)

## 2015-01-02 NOTE — ED Provider Notes (Signed)
[facility] Emergency Department Provider Note  ____________________________________________  Time seen: Approximately 6:11 PM  I have reviewed the triage vital signs and the nursing notes.   HISTORY  Chief Complaint Knee Pain    HPI George Hopkins is a 64 y.o. male presents for evaluation of swollen right knee pain times one day. Patient states he has past history of gout and takes medication for but this just came out" nowhere"   Past Medical History  Diagnosis Date  . Hypertension   . CHF (congestive heart failure)   . COPD (chronic obstructive pulmonary disease)   . Diverticulitis   . Irregular heart beat   . Gout     Patient Active Problem List   Diagnosis Date Noted  . CHF (congestive heart failure) 08/31/2014    Past Surgical History  Procedure Laterality Date  . Cardiac surgery      Current Outpatient Rx  Name  Route  Sig  Dispense  Refill  . carvedilol (COREG) 12.5 MG tablet   Oral   Take 3 tablets (37.5 mg total) by mouth 2 (two) times daily with a meal.   180 tablet   0   . colchicine (COLCRYS) 0.6 MG tablet   Oral   Take 0.6 mg by mouth daily.         Marland Kitchen docusate sodium (COLACE) 100 MG capsule   Oral   Take 100 mg by mouth 2 (two) times daily.         . famotidine (PEPCID) 20 MG tablet   Oral   Take 1 tablet (20 mg total) by mouth 2 (two) times daily.   60 tablet   0   . fluticasone-salmeterol (ADVAIR HFA) 115-21 MCG/ACT inhaler   Inhalation   Inhale 2 puffs into the lungs 2 (two) times daily.   1 Inhaler   12   . gabapentin (NEURONTIN) 300 MG capsule   Oral   Take 1 capsule (300 mg total) by mouth 2 (two) times daily.   60 capsule   0   . HYDROcodone-acetaminophen (NORCO) 5-325 MG per tablet   Oral   Take 1-2 tablets by mouth every 4 (four) hours as needed for moderate pain.   15 tablet   0   . Ipratropium-Albuterol (COMBIVENT) 20-100 MCG/ACT AERS respimat   Inhalation   Inhale 1 puff into the  lungs every 6 (six) hours.         Marland Kitchen lisinopril (PRINIVIL,ZESTRIL) 20 MG tablet   Oral   Take 20 mg by mouth daily.         . naproxen (NAPROSYN) 500 MG tablet   Oral   Take 1 tablet (500 mg total) by mouth 2 (two) times daily with a meal.   60 tablet   0   . OXYGEN-HELIUM IN   Inhalation   Inhale into the lungs.         . pravastatin (PRAVACHOL) 20 MG tablet   Oral   Take 20 mg by mouth daily.         . predniSONE (DELTASONE) 5 MG tablet   Oral   Take 1 tablet (5 mg total) by mouth daily with breakfast. 3 tabs day 1; 2 tabs day 2, 1 tab day 3,4.   7 tablet   0   . tiotropium (SPIRIVA) 18 MCG inhalation capsule   Inhalation   Place 18 mcg into inhaler and inhale daily.         Marland Kitchen torsemide (DEMADEX) 100  MG tablet   Oral   Take 100 mg by mouth daily.           Allergies Penicillins  No family history on file.  Social History Social History  Substance Use Topics  . Smoking status: Former Games developer  . Smokeless tobacco: Never Used  . Alcohol Use: No    Review of Systems Constitutional: No fever/chills Eyes: No visual changes. ENT: No sore throat. Cardiovascular: Denies chest pain. Respiratory: Denies shortness of breath. Gastrointestinal: No abdominal pain.  No nausea, no vomiting.  No diarrhea.  No constipation. Genitourinary: Negative for dysuria. Musculoskeletal: Positive for right knee. Skin: Negative for rash. Neurological: Negative for headaches, focal weakness or numbness.  10-point ROS otherwise negative.  ____________________________________________   PHYSICAL EXAM:  VITAL SIGNS: ED Triage Vitals  Enc Vitals Group     BP --      Pulse --      Resp --      Temp --      Temp src --      SpO2 --      Weight --      Height --      Head Cir --      Peak Flow --      Pain Score --      Pain Loc --      Pain Edu? --      Excl. in GC? --     Constitutional: Alert and oriented. Well appearing and in no acute distress. Eyes:  Conjunctivae are normal. PERRL. EOMI. Head: Atraumatic. Nose: No congestion/rhinnorhea. Mouth/Throat: Mucous membranes are moist.  Oropharynx non-erythematous. Neck: No stridor.   Cardiovascular: Normal rate, regular rhythm. Grossly normal heart sounds.  Good peripheral circulation. Respiratory: Normal respiratory effort.  No retractions. Lungs CTAB. Gastrointestinal: Soft and nontender. No distention. No abdominal bruits. No CVA tenderness. Musculoskeletal: Right knee swollen tender with warmth. Range of motion increased pain with extension. Neurologic:  Normal speech and language. No gross focal neurologic deficits are appreciated. Skin:  Skin is warm, dry and intact. No rash noted. Psychiatric: Mood and affect are normal. Speech and behavior are normal.  ____________________________________________   LABS (all labs ordered are listed, but only abnormal results are displayed)  Labs Reviewed - No data to display ____________________________________________  RADIOLOGY  Positive for joint effusion.   PROCEDURES  Procedure(s) performed: None  Critical Care performed: No  ____________________________________________   INITIAL IMPRESSION / ASSESSMENT AND PLAN / ED COURSE  Pertinent labs & imaging results that were available during my care of the patient were reviewed by me and considered in my medical decision making (see chart for details).  2 exacerbation of gout. Rx given for naproxen 500 mg twice a day #60. Hydrocodone one to 2 every 4-6 hours as needed for pain. Patient follow-up with PCP or return to the ER with any worsening symptomology. Patient states much improvement from Toradol injection. ____________________________________________   FINAL CLINICAL IMPRESSION(S) / ED DIAGNOSES  Final diagnoses:  Acute idiopathic gout of right knee      Evangeline Dakin, PA-C 01/02/15 1857  Loleta Rose, MD 01/02/15 [phone]

## 2015-01-02 NOTE — ED Notes (Signed)
Patient has a history of gout. States pain to right knee for last three days.

## 2015-04-24 ENCOUNTER — Emergency Department: Payer: Medicare Other

## 2015-04-24 ENCOUNTER — Emergency Department
Admission: EM | Admit: 2015-04-24 | Discharge: 2015-04-24 | Disposition: A | Discharge status: Home / Self Care | Payer: Medicare Other | Attending: Emergency Medicine | Admitting: Emergency Medicine

## 2015-04-24 ENCOUNTER — Encounter: Payer: Self-pay | Admitting: Emergency Medicine

## 2015-04-24 DIAGNOSIS — Z79899 Other long term (current) drug therapy: Secondary | ICD-10-CM | POA: Insufficient documentation

## 2015-04-24 DIAGNOSIS — M79605 Pain in left leg: Secondary | ICD-10-CM | POA: Diagnosis present

## 2015-04-24 DIAGNOSIS — Z87891 Personal history of nicotine dependence: Secondary | ICD-10-CM | POA: Diagnosis not present

## 2015-04-24 DIAGNOSIS — Z880 Allergy status to penicillin: Secondary | ICD-10-CM | POA: Insufficient documentation

## 2015-04-24 DIAGNOSIS — I10 Essential (primary) hypertension: Secondary | ICD-10-CM | POA: Diagnosis not present

## 2015-04-24 DIAGNOSIS — M10072 Idiopathic gout, left ankle and foot: Secondary | ICD-10-CM | POA: Diagnosis not present

## 2015-04-24 DIAGNOSIS — M109 Gout, unspecified: Secondary | ICD-10-CM

## 2015-04-24 LAB — BASIC METABOLIC PANEL
ANION GAP: 6 (ref 5–15)
BUN: 32 mg/dL — ABNORMAL HIGH (ref 6–20)
CO2: 26 mmol/L (ref 22–32)
Calcium: 8.1 mg/dL — ABNORMAL LOW (ref 8.9–10.3)
Chloride: 107 mmol/L (ref 101–111)
Creatinine, Ser: 1.92 mg/dL — ABNORMAL HIGH (ref 0.61–1.24)
GFR calc Af Amer: 40 mL/min — ABNORMAL LOW (ref >60)
GFR, EST NON AFRICAN AMERICAN: 35 mL/min — AB (ref >60)
Glucose, Bld: 102 mg/dL — ABNORMAL HIGH (ref 65–99)
POTASSIUM: 4 mmol/L (ref 3.5–5.1)
SODIUM: 139 mmol/L (ref 135–145)

## 2015-04-24 LAB — CBC WITH DIFFERENTIAL/PLATELET
BASOS ABS: 0.1 10*3/uL (ref 0–0.1)
Basophils Relative: 1 %
EOS ABS: 0.1 10*3/uL (ref 0–0.7)
EOS PCT: 1 %
HCT: 38.7 % — ABNORMAL LOW (ref 40.0–52.0)
Hemoglobin: 12.3 g/dL — ABNORMAL LOW (ref 13.0–18.0)
LYMPHS PCT: 14 %
Lymphs Abs: 1.8 10*3/uL (ref 1.0–3.6)
MCH: 29.1 pg (ref 26.0–34.0)
MCHC: 31.9 g/dL — ABNORMAL LOW (ref 32.0–36.0)
MCV: 91.4 fL (ref 80.0–100.0)
Monocytes Absolute: 1.1 10*3/uL — ABNORMAL HIGH (ref 0.2–1.0)
Monocytes Relative: 9 %
Neutro Abs: 9.6 10*3/uL — ABNORMAL HIGH (ref 1.4–6.5)
Neutrophils Relative %: 75 %
PLATELETS: 339 10*3/uL (ref 150–440)
RBC: 4.23 MIL/uL — AB (ref 4.40–5.90)
RDW: 15.6 % — ABNORMAL HIGH (ref 11.5–14.5)
WBC: 12.6 10*3/uL — AB (ref 3.8–10.6)

## 2015-04-24 MED ORDER — OXYCODONE-ACETAMINOPHEN 5-325 MG PO TABS
1.0000 | ORAL_TABLET | Freq: Once | ORAL | Status: AC
  Administered 2015-04-24: 1 via ORAL
  Filled 2015-04-24: qty 1

## 2015-04-24 MED ORDER — KETOROLAC TROMETHAMINE 30 MG/ML IJ SOLN
15.0000 mg | Freq: Once | INTRAMUSCULAR | Status: AC
  Administered 2015-04-24: 15 mg via INTRAVENOUS
  Filled 2015-04-24: qty 1

## 2015-04-24 MED ORDER — OXYCODONE-ACETAMINOPHEN 5-325 MG PO TABS: 1.0000 | ORAL_TABLET | Freq: Four times a day (QID) | ORAL | Status: DC | PRN

## 2015-04-24 MED ORDER — CARVEDILOL 6.25 MG PO TABS
6.2500 mg | ORAL_TABLET | Freq: Two times a day (BID) | ORAL | Status: DC
  Administered 2015-04-24: 6.25 mg via ORAL
  Filled 2015-04-24: qty 1

## 2015-04-24 MED ORDER — MORPHINE SULFATE (PF) 4 MG/ML IV SOLN
4.0000 mg | Freq: Once | INTRAVENOUS | Status: AC
  Administered 2015-04-24: 4 mg via INTRAVENOUS
  Filled 2015-04-24: qty 1

## 2015-04-24 NOTE — Discharge Instructions (Signed)
Gout °Gout is when your joints become red, sore, and swell (inflamed). This is caused by the buildup of uric acid crystals in the joints. Uric acid is a chemical that is normally in the blood. If the level of uric acid gets too high in the blood, these crystals form in your joints and tissues. Over time, these crystals can form into masses near the joints and tissues. These masses can destroy bone and cause the bone to look misshapen (deformed). °HOME CARE  °· Do not take aspirin for pain. °· Only take medicine as told by your doctor. °· Rest the joint as much as you can. When in bed, keep sheets and blankets off painful areas. °· Keep the sore joints raised (elevated). °· Put warm or cold packs on painful joints. Use of warm or cold packs depends on which works best for you. °· Use crutches if the painful joint is in your leg. °· Drink enough fluids to keep your pee (urine) clear or pale yellow. Limit alcohol, sugary drinks, and drinks with fructose in them. °· Follow your diet instructions. Pay careful attention to how much protein you eat. Include fruits, vegetables, whole grains, and fat-free or low-fat milk products in your daily diet. Talk to your doctor or dietitian about the use of coffee, vitamin C, and cherries. These may help lower uric acid levels. °· Keep a healthy body weight. °GET HELP RIGHT AWAY IF:  °· You have watery poop (diarrhea), throw up (vomit), or have any side effects from medicines. °· You do not feel better in 24 hours, or you are getting worse. °· Your joint becomes suddenly more tender, and you have chills or a fever. °MAKE SURE YOU:  °· Understand these instructions. °· Will watch your condition. °· Will get help right away if you are not doing well or get worse. °  °This information is not intended to replace advice given to you by your health care provider. Make sure you discuss any questions you have with your health care provider. °  °Document Released: 01/15/2008 Document Revised:  04/28/2014 Document Reviewed: 11/19/2011 °Elsevier Interactive Patient Education ©2016 [employer] ° °

## 2015-04-24 NOTE — ED Notes (Signed)
Pt to ed via ems from home with c/o left leg pain that started before christmas.  Pt states it has progressively gotten worse with pain mostly around left ankle.  +pulse, movement and sensation noted.  Pt denies injury to leg.  Color good, skin warm and dry,

## 2015-04-24 NOTE — ED Notes (Signed)
Lunch tray given to pt.

## 2015-04-24 NOTE — ED Provider Notes (Addendum)
[facility] Emergency Department Provider Note  ____________________________________________   I have reviewed the triage vital signs and the nursing notes.   HISTORY  Chief Complaint Leg Pain    HPI George Hopkins is a 65 y.o. male History of atrial fibrillation, history of gout and chronic lower external pain from gout presents with ankle pain and the left side for a little over a week. Patient had no injury. He states it hurts to walk. He has tried colchicine but do not seem to help. Denies any fever chills nausea or vomiting chest pain or shortness of breath.  Past Medical History  Diagnosis Date  . Hypertension   . CHF (congestive heart failure) (HCC)   . COPD (chronic obstructive pulmonary disease) (HCC)   . Diverticulitis   . Irregular heart beat   . Gout     Patient Active Problem List   Diagnosis Date Noted  . CHF (congestive heart failure) (HCC) 08/31/2014    Past Surgical History  Procedure Laterality Date  . Cardiac surgery      Current Outpatient Rx  Name  Route  Sig  Dispense  Refill  . carvedilol (COREG) 6.25 MG tablet   Oral   Take 6.25 mg by mouth 2 (two) times daily.         . colchicine (COLCRYS) 0.6 MG tablet   Oral   Take 0.6 mg by mouth daily.         Marland Kitchen. gabapentin (NEURONTIN) 300 MG capsule   Oral   Take 1 capsule (300 mg total) by mouth 2 (two) times daily.   60 capsule   0   . Ipratropium-Albuterol (COMBIVENT) 20-100 MCG/ACT AERS respimat   Inhalation   Inhale 2 puffs into the lungs 4 (four) times daily.          Marland Kitchen. lisinopril (PRINIVIL,ZESTRIL) 20 MG tablet   Oral   Take 20 mg by mouth daily.         . Melatonin 3 MG TABS   Oral   Take 3 mg by mouth at bedtime as needed (for sleep).         . metoprolol tartrate (LOPRESSOR) 25 MG tablet   Oral   Take 6.25 mg by mouth 4 (four) times daily.         . pravastatin (PRAVACHOL) 20 MG tablet   Oral   Take 20 mg by mouth every morning.           Marland Kitchen. spironolactone (ALDACTONE) 25 MG tablet   Oral   Take 12.5 mg by mouth 2 (two) times daily.         Marland Kitchen. tiotropium (SPIRIVA) 18 MCG inhalation capsule   Inhalation   Place 18 mcg into inhaler and inhale daily.         Marland Kitchen. torsemide (DEMADEX) 20 MG tablet   Oral   Take 20 mg by mouth daily.         . carvedilol (COREG) 12.5 MG tablet   Oral   Take 3 tablets (37.5 mg total) by mouth 2 (two) times daily with a meal. Patient not taking: Reported on 04/24/2015   180 tablet   0   . famotidine (PEPCID) 20 MG tablet   Oral   Take 1 tablet (20 mg total) by mouth 2 (two) times daily. Patient not taking: Reported on 04/24/2015   60 tablet   0   . fluticasone-salmeterol (ADVAIR HFA) 115-21 MCG/ACT inhaler   Inhalation   Inhale 2  puffs into the lungs 2 (two) times daily. Patient not taking: Reported on 04/24/2015   1 Inhaler   12   . HYDROcodone-acetaminophen (NORCO) 5-325 MG per tablet   Oral   Take 1-2 tablets by mouth every 4 (four) hours as needed for moderate pain. Patient not taking: Reported on 04/24/2015   15 tablet   0   . naproxen (NAPROSYN) 500 MG tablet   Oral   Take 1 tablet (500 mg total) by mouth 2 (two) times daily with a meal. Patient not taking: Reported on 04/24/2015   60 tablet   0     Allergies Penicillins  History reviewed. No pertinent family history.  Social History Social History  Substance Use Topics  . Smoking status: Former Games developer  . Smokeless tobacco: Never Used  . Alcohol Use: No    Review of Systems Constitutional: No fever/chills Eyes: No visual changes. ENT: No sore throat. No stiff neck no neck pain Cardiovascular: Denies chest pain. Respiratory: Denies shortness of breath. Gastrointestinal:   no vomiting.  No diarrhea.  No constipation. Genitourinary: Negative for dysuria. Musculoskeletal: Negative lower extremity swelling Skin: Negative for rash. Neurological: Negative for headaches, focal weakness or numbness. 10-point  ROS otherwise negative.  ____________________________________________   PHYSICAL EXAM:  VITAL SIGNS: ED Triage Vitals  Enc Vitals Group     BP 04/24/15 0956 98/49 mmHg     Pulse Rate 04/24/15 0956 102     Resp 04/24/15 1030 33     Temp 04/24/15 0956 98.6 F (37 C)     Temp Source 04/24/15 0956 Oral     SpO2 04/24/15 0956 95 %     Weight 04/24/15 0956 200 lb (90.719 kg)     Height 04/24/15 0956 5\' 11"  (1.803 m)     Head Cir --      Peak Flow --      Pain Score 04/24/15 0957 10     Pain Loc --      Pain Edu? --      Excl. in GC? --     Constitutional: Alert and oriented. Well appearing and in no acute distress. Eyes: Conjunctivae are normal. PERRL. EOMI. Head: Atraumatic. Nose: No congestion/rhinnorhea. Mouth/Throat: Mucous membranes are moist.  Oropharynx non-erythematous. Neck: No stridor.   Nontender with no meningismus Cardiovascular:irregularly irregular. Grossly normal heart sounds.  Good peripheral circulation. Respiratory: Normal respiratory effort.  No retractions. Lungs CTAB. Abdominal: Soft and nontender. No distention. No guarding no rebound Back:  There is no focal tenderness or step off there is no midline tenderness there are no lesions noted. there is no CVA tenderness Musculoskeletal:there is left ankle discomfort with no significant erythema, no deformity, strong distal pulses, symmetric calves with no tenderness or DVT signs. There is soft compartments.  Neurologic:  Normal speech and language. No gross focal neurologic deficits are appreciated.  Skin:  Skin is warm, dry and intact. No rash noted. Psychiatric: Mood and affect are normal. Speech and behavior are normal.  ____________________________________________   LABS (all labs ordered are listed, but only abnormal results are displayed)  Labs Reviewed  CBC WITH DIFFERENTIAL/PLATELET - Abnormal; Notable for the following:    WBC 12.6 (*)    RBC 4.23 (*)    Hemoglobin 12.3 (*)    HCT 38.7 (*)     MCHC 31.9 (*)    RDW 15.6 (*)    Neutro Abs 9.6 (*)    Monocytes Absolute 1.1 (*)    All other components within  normal limits  BASIC METABOLIC PANEL   ____________________________________________  EKG  I personally interpreted any EKGs ordered by me or triage  ____________________________________________  RADIOLOGY  I reviewed any imaging ordered by me or triage that were performed during my shift ____________________________________________   PROCEDURES  Procedure(s) performed: None  Critical Care performed: None  ____________________________________________   INITIAL IMPRESSION / ASSESSMENT AND PLAN / ED COURSE  Pertinent labs & imaging results that were available during my care of the patient were reviewed by me and considered in my medical decision making (see chart for details).  Patient with gradual onset left ankle pain along history of gout. This does not appear to be infected. Blood work is reassuring. He is in A. Fib with occasional RVR, heart rate running around 105 at baseline however. In times he speeds up. He did not take his blood pressure medications yet today. He has had no fever no chills.his white count is slightly elevated however that is a nonspecific finding. X-ray shows no evidence of fracture. There is no evidence of DVT no evidence of vascular insufficiency after Toradol and morphine patient's pain is nearly gone. He is chatting on the telephone. This does appear very consistent with prior gout and patient states that is exactly what it feels like. Do not think that a attempt to former arthrocentesis would be in the patient's best interest at this juncture but return precautions and follow-up with her extensively understood and explained  ----------------------------------------- 2:24 PM on 04/24/2015 -----------------------------------------  Patient able to ambulate with no significant difficulty, however he would like to have a walker if the  pain comes back and gets worse at home. We will give him that. Return precautions and follow-up given and the patient is eager to go home____________________________________________   FINAL CLINICAL IMPRESSION(S) / ED DIAGNOSES  Final diagnoses:  None     Jeanmarie Plant, MD 04/24/15 1222  Jeanmarie Plant, MD 04/24/15 1425

## 2015-04-24 NOTE — ED Notes (Signed)
Pt given walker to go per dr mc shane's order.

## 2015-05-15 ENCOUNTER — Ambulatory Visit (INDEPENDENT_AMBULATORY_CARE_PROVIDER_SITE_OTHER): Payer: Medicare Other | Admitting: Sports Medicine

## 2015-05-15 ENCOUNTER — Encounter: Payer: Self-pay | Admitting: Sports Medicine

## 2015-05-15 DIAGNOSIS — M79676 Pain in unspecified toe(s): Secondary | ICD-10-CM

## 2015-05-15 DIAGNOSIS — B351 Tinea unguium: Secondary | ICD-10-CM

## 2015-05-15 DIAGNOSIS — I739 Peripheral vascular disease, unspecified: Secondary | ICD-10-CM

## 2015-05-15 DIAGNOSIS — G609 Hereditary and idiopathic neuropathy, unspecified: Secondary | ICD-10-CM

## 2015-05-15 NOTE — Progress Notes (Signed)
Patient ID: George Hopkins, male   DOB: 05-08-1950, 65 y.o.   MRN: [id number] Subjective: George Hopkins is a 65 y.o. male patient seen today in office with complaint of painful thickened and elongated toenails; unable to trim. Patient denies history of Diabetes, Neuropathy, or Vascular disease. Admits to CHF on O2 with swelling in legs.Patient has no other pedal complaints at this time.   Patient Active Problem List   Diagnosis Date Noted  . CHF (congestive heart failure) (HCC) 08/31/2014   Current Outpatient Prescriptions on File Prior to Visit  Medication Sig Dispense Refill  . carvedilol (COREG) 12.5 MG tablet Take 3 tablets (37.5 mg total) by mouth 2 (two) times daily with a meal. (Patient not taking: Reported on 04/24/2015) 180 tablet 0  . carvedilol (COREG) 6.25 MG tablet Take 6.25 mg by mouth 2 (two) times daily.    . colchicine (COLCRYS) 0.6 MG tablet Take 0.6 mg by mouth daily.    . famotidine (PEPCID) 20 MG tablet Take 1 tablet (20 mg total) by mouth 2 (two) times daily. (Patient not taking: Reported on 04/24/2015) 60 tablet 0  . fluticasone-salmeterol (ADVAIR HFA) 115-21 MCG/ACT inhaler Inhale 2 puffs into the lungs 2 (two) times daily. (Patient not taking: Reported on 04/24/2015) 1 Inhaler 12  . gabapentin (NEURONTIN) 300 MG capsule Take 1 capsule (300 mg total) by mouth 2 (two) times daily. 60 capsule 0  . HYDROcodone-acetaminophen (NORCO) 5-325 MG per tablet Take 1-2 tablets by mouth every 4 (four) hours as needed for moderate pain. (Patient not taking: Reported on 04/24/2015) 15 tablet 0  . Ipratropium-Albuterol (COMBIVENT) 20-100 MCG/ACT AERS respimat Inhale 2 puffs into the lungs 4 (four) times daily.     Marland Kitchen lisinopril (PRINIVIL,ZESTRIL) 20 MG tablet Take 20 mg by mouth daily.    . Melatonin 3 MG TABS Take 3 mg by mouth at bedtime as needed (for sleep).    . metoprolol tartrate (LOPRESSOR) 25 MG tablet Take 6.25 mg by mouth 4 (four) times daily.    . naproxen (NAPROSYN) 500 MG  tablet Take 1 tablet (500 mg total) by mouth 2 (two) times daily with a meal. (Patient not taking: Reported on 04/24/2015) 60 tablet 0  . oxyCODONE-acetaminophen (ROXICET) 5-325 MG tablet Take 1 tablet by mouth every 6 (six) hours as needed. 14 tablet 0  . pravastatin (PRAVACHOL) 20 MG tablet Take 20 mg by mouth every morning.     Marland Kitchen spironolactone (ALDACTONE) 25 MG tablet Take 12.5 mg by mouth 2 (two) times daily.    Marland Kitchen tiotropium (SPIRIVA) 18 MCG inhalation capsule Place 18 mcg into inhaler and inhale daily.    Marland Kitchen torsemide (DEMADEX) 20 MG tablet Take 20 mg by mouth daily.     No current facility-administered medications on file prior to visit.   Allergies  Allergen Reactions  . Penicillins Swelling     Objective: Physical Exam  General: Well developed, nourished, no acute distress, awake, alert and oriented x 3  Vascular: Dorsalis pedis artery 1/4 bilateral, Posterior tibial artery 1/4 bilateral, skin temperature warm to warm proximal to distal bilateral lower extremities, no varicosities, scant pedal hair present bilateral. + Trace edema to bilateral lower extremities  Neurological: Gross sensation present via light touch bilateral. Vibratory and Protective sensation with SWMF slightly diminished L>R, possible early idiopathic neuropathy.  Dermatological: Skin is warm, dry, and supple bilateral, Nails 1-10 are tender, long, thick, and discolored with mild subungal debris, no webspace macerations present bilateral, no open lesions present bilateral,  no callus/corns/hyperkeratotic tissue present bilateral. No signs of infection bilateral.  Musculoskeletal: Mild asymptomatic lesser hammertoe deformities noted and Pes planus foot type bilateral. Muscular strength within normal limits without pain on range of motion. No pain with calf compression bilateral.  Assessment and Plan:  Problem List Items Addressed This Visit    None    Visit Diagnoses    Dermatophytosis of nail    -  Primary     Pain of toe, unspecified laterality        PVD (peripheral vascular disease) (HCC)        Idiopathic peripheral neuropathy (HCC)        Possible      -Examined patient.  -Discussed treatment options for painful mycotic nails. -Mechanically debrided and reduced mycotic nails with sterile nail nipper and dremel nail file without incident. -Recommend lower limb elevation to assist with edema control daily. -Recommend good supportive shoes daily for foot type. -Patient to return in 3 months for follow up evaluation or sooner if symptoms worsen.  Asencion Islam, DPM

## 2015-05-27 ENCOUNTER — Encounter: Payer: Self-pay | Admitting: Emergency Medicine

## 2015-05-27 ENCOUNTER — Emergency Department: Payer: Medicare Other

## 2015-05-27 ENCOUNTER — Emergency Department
Admission: EM | Admit: 2015-05-27 | Discharge: 2015-05-27 | Disposition: A | Discharge status: Home / Self Care | Payer: Medicare Other | Attending: Emergency Medicine | Admitting: Emergency Medicine

## 2015-05-27 DIAGNOSIS — Z79899 Other long term (current) drug therapy: Secondary | ICD-10-CM | POA: Diagnosis not present

## 2015-05-27 DIAGNOSIS — I10 Essential (primary) hypertension: Secondary | ICD-10-CM | POA: Insufficient documentation

## 2015-05-27 DIAGNOSIS — Z880 Allergy status to penicillin: Secondary | ICD-10-CM | POA: Diagnosis not present

## 2015-05-27 DIAGNOSIS — J449 Chronic obstructive pulmonary disease, unspecified: Secondary | ICD-10-CM | POA: Insufficient documentation

## 2015-05-27 DIAGNOSIS — Z87891 Personal history of nicotine dependence: Secondary | ICD-10-CM | POA: Insufficient documentation

## 2015-05-27 DIAGNOSIS — M25561 Pain in right knee: Secondary | ICD-10-CM | POA: Insufficient documentation

## 2015-05-27 DIAGNOSIS — M25461 Effusion, right knee: Secondary | ICD-10-CM | POA: Diagnosis not present

## 2015-05-27 LAB — CBC WITH DIFFERENTIAL/PLATELET
Basophils Absolute: 0.1 10*3/uL (ref 0–0.1)
Basophils Relative: 1 %
EOS ABS: 0 10*3/uL (ref 0–0.7)
EOS PCT: 0 %
HCT: 41 % (ref 40.0–52.0)
Hemoglobin: 13.2 g/dL (ref 13.0–18.0)
LYMPHS ABS: 1.6 10*3/uL (ref 1.0–3.6)
LYMPHS PCT: 15 %
MCH: 29.4 pg (ref 26.0–34.0)
MCHC: 32.1 g/dL (ref 32.0–36.0)
MCV: 91.7 fL (ref 80.0–100.0)
MONO ABS: 1.6 10*3/uL — AB (ref 0.2–1.0)
MONOS PCT: 15 %
Neutro Abs: 7.5 10*3/uL — ABNORMAL HIGH (ref 1.4–6.5)
Neutrophils Relative %: 69 %
PLATELETS: 252 10*3/uL (ref 150–440)
RBC: 4.47 MIL/uL (ref 4.40–5.90)
RDW: 16.8 % — AB (ref 11.5–14.5)
WBC: 10.7 10*3/uL — AB (ref 3.8–10.6)

## 2015-05-27 LAB — BASIC METABOLIC PANEL
Anion gap: 5 (ref 5–15)
BUN: 14 mg/dL (ref 6–20)
CHLORIDE: 109 mmol/L (ref 101–111)
CO2: 27 mmol/L (ref 22–32)
CREATININE: 1.36 mg/dL — AB (ref 0.61–1.24)
Calcium: 8.7 mg/dL — ABNORMAL LOW (ref 8.9–10.3)
GFR calc Af Amer: >60 mL/min (ref >60)
GFR calc non Af Amer: 52 mL/min — ABNORMAL LOW (ref >60)
GLUCOSE: 97 mg/dL (ref 65–99)
POTASSIUM: 3.7 mmol/L (ref 3.5–5.1)
SODIUM: 141 mmol/L (ref 135–145)

## 2015-05-27 LAB — URIC ACID: URIC ACID, SERUM: 8.4 mg/dL — AB (ref 4.4–7.6)

## 2015-05-27 MED ORDER — LIDOCAINE HCL (PF) 1 % IJ SOLN
INTRAMUSCULAR | Status: AC
  Filled 2015-05-27: qty 5

## 2015-05-27 MED ORDER — PREDNISONE 20 MG PO TABS: 20.0000 mg | ORAL_TABLET | Freq: Every day | ORAL | Status: DC

## 2015-05-27 MED ORDER — KETOROLAC TROMETHAMINE 30 MG/ML IJ SOLN
30.0000 mg | Freq: Once | INTRAMUSCULAR | Status: AC
Start: 2015-05-27 — End: 2015-05-27
  Administered 2015-05-27: 30 mg via INTRAVENOUS
  Filled 2015-05-27: qty 1

## 2015-05-27 NOTE — ED Provider Notes (Signed)
Time Seen: Approximately 0240  I have reviewed the triage notes  Chief Complaint: Knee Pain   History of Present Illness: George Hopkins is a 65 y.o. male who presents with relatively acute onset of right nontraumatic knee pain. Patient states he started developing some swelling and increased pain on Thursday evening. Patient has a history of gout and states his pain is somewhat similar but he's had increased swelling in the right knee. He denies any fever at home. He has chronic supplemental oxygen therapy secondary to COPD. He denies any fever or chills or productive cough or wheezing. He denies any other significant joint discomfort   Past Medical History  Diagnosis Date  . Hypertension   . CHF (congestive heart failure) (HCC)   . COPD (chronic obstructive pulmonary disease) (HCC)   . Diverticulitis   . Irregular heart beat   . Gout     Patient Active Problem List   Diagnosis Date Noted  . CHF (congestive heart failure) (HCC) 08/31/2014    Past Surgical History  Procedure Laterality Date  . Cardiac surgery      Past Surgical History  Procedure Laterality Date  . Cardiac surgery      Current Outpatient Rx  Name  Route  Sig  Dispense  Refill  . carvedilol (COREG) 12.5 MG tablet   Oral   Take 3 tablets (37.5 mg total) by mouth 2 (two) times daily with a meal. Patient not taking: Reported on 04/24/2015   180 tablet   0   . carvedilol (COREG) 6.25 MG tablet   Oral   Take 6.25 mg by mouth 2 (two) times daily.         . colchicine (COLCRYS) 0.6 MG tablet   Oral   Take 0.6 mg by mouth daily.         . famotidine (PEPCID) 20 MG tablet   Oral   Take 1 tablet (20 mg total) by mouth 2 (two) times daily. Patient not taking: Reported on 04/24/2015   60 tablet   0   . fluticasone-salmeterol (ADVAIR HFA) 115-21 MCG/ACT inhaler   Inhalation   Inhale 2 puffs into the lungs 2 (two) times daily. Patient not taking: Reported on 04/24/2015   1 Inhaler   12   .  gabapentin (NEURONTIN) 300 MG capsule   Oral   Take 1 capsule (300 mg total) by mouth 2 (two) times daily.   60 capsule   0   . HYDROcodone-acetaminophen (NORCO) 5-325 MG per tablet   Oral   Take 1-2 tablets by mouth every 4 (four) hours as needed for moderate pain. Patient not taking: Reported on 04/24/2015   15 tablet   0   . Ipratropium-Albuterol (COMBIVENT) 20-100 MCG/ACT AERS respimat   Inhalation   Inhale 2 puffs into the lungs 4 (four) times daily.          Marland Kitchen lisinopril (PRINIVIL,ZESTRIL) 20 MG tablet   Oral   Take 20 mg by mouth daily.         . Melatonin 3 MG TABS   Oral   Take 3 mg by mouth at bedtime as needed (for sleep).         . metoprolol tartrate (LOPRESSOR) 25 MG tablet   Oral   Take 6.25 mg by mouth 4 (four) times daily.         . naproxen (NAPROSYN) 500 MG tablet   Oral   Take 1 tablet (500 mg total) by mouth 2 (two)  times daily with a meal. Patient not taking: Reported on 04/24/2015   60 tablet   0   . oxyCODONE-acetaminophen (ROXICET) 5-325 MG tablet   Oral   Take 1 tablet by mouth every 6 (six) hours as needed.   14 tablet   0   . pravastatin (PRAVACHOL) 20 MG tablet   Oral   Take 20 mg by mouth every morning.          . predniSONE (DELTASONE) 20 MG tablet   Oral   Take 1 tablet (20 mg total) by mouth daily.   5 tablet   0   . spironolactone (ALDACTONE) 25 MG tablet   Oral   Take 12.5 mg by mouth 2 (two) times daily.         Marland Kitchen tiotropium (SPIRIVA) 18 MCG inhalation capsule   Inhalation   Place 18 mcg into inhaler and inhale daily.         Marland Kitchen torsemide (DEMADEX) 20 MG tablet   Oral   Take 20 mg by mouth daily.           Allergies:  Penicillins  Family History: No family history on file.  Social History: Social History  Substance Use Topics  . Smoking status: Former Games developer  . Smokeless tobacco: Never Used  . Alcohol Use: No     Review of Systems:   10 point review of systems was performed and was  otherwise negative:  Constitutional: No fever Eyes: No visual disturbances ENT: No sore throat, ear pain Cardiac: No chest pain Respiratory: No shortness of breath, wheezing, or stridor Abdomen: No abdominal pain, no vomiting, No diarrhea Endocrine: No weight loss, No night sweats Extremities: Pain is exclusively in her right knee and patient states he had difficulty ambulating at home and had difficulty flexing his right knee. Skin: No rashes, easy bruising Neurologic: No focal weakness, trouble with speech or swollowing Urologic: No dysuria, Hematuria, or urinary frequency   Physical Exam:  ED Triage Vitals  Enc Vitals Group     BP 05/27/15 0230 150/82 mmHg     Pulse Rate 05/27/15 0231 89     Resp 05/27/15 0231 20     Temp 05/27/15 0231 98.3 F (36.8 C)     Temp Source 05/27/15 0231 Oral     SpO2 05/27/15 0231 97 %     Weight 05/27/15 0231 243 lb (110.224 kg)     Height 05/27/15 0231 5\' 11"  (1.803 m)     Head Cir --      Peak Flow --      Pain Score 05/27/15 0232 1     Pain Loc --      Pain Edu? --      Excl. in GC? --     General: Awake , Alert , and Oriented times 3; GCS 15 Head: Normal cephalic , atraumatic Eyes: Pupils equal , round, reactive to light Nose/Throat: No nasal drainage, patent upper airway without erythema or exudate.  Neck: Supple, Full range of motion, No anterior adenopathy or palpable thyroid masses Lungs: Clear to ascultation without wheezes , rhonchi, or rales Heart: Regular rate, regular rhythm without murmurs , gallops , or rubs Abdomen: Soft, non tender without rebound, guarding , or rigidity; bowel sounds positive and symmetric in all 4 quadrants. No organomegaly .        Extremities: The right knee shows a rather large effusion especially suprapatellar region. There is no fluctuance of the knee And there does not appear  to be any erythema or warmth to palpation or visualization. The knee is otherwise stable to varus and valgus rotation with a  negative anterior drawer sign. Negative Lachman test Neurologic: normal ambulation, Motor symmetric without deficits, sensory intact Skin: warm, dry, no rashes   Labs:   All laboratory work was reviewed including any pertinent negatives or positives listed below:  Labs Reviewed  BASIC METABOLIC PANEL - Abnormal; Notable for the following:    Creatinine, Ser 1.36 (*)    Calcium 8.7 (*)    GFR calc non Af Amer 52 (*)    All other components within normal limits  CBC WITH DIFFERENTIAL/PLATELET - Abnormal; Notable for the following:    WBC 10.7 (*)    RDW 16.8 (*)    Neutro Abs 7.5 (*)    Monocytes Absolute 1.6 (*)    All other components within normal limits  URIC ACID - Abnormal; Notable for the following:    Uric Acid, Serum 8.4 (*)    All other components within normal limits   reviewed the laboratory work shows an elevated uric acid level otherwise no significant findings     Radiology:    EXAM: Right LOWER EXTREMITY VENOUS DOPPLER ULTRASOUND  TECHNIQUE: Gray-scale sonography with graded compression, as well as color Doppler and duplex ultrasound were performed to evaluate the lower extremity deep venous systems from the level of the common femoral vein and including the common femoral, femoral, profunda femoral, popliteal and calf veins including the posterior tibial, peroneal and gastrocnemius veins when visible. The superficial great saphenous vein was also interrogated. Spectral Doppler was utilized to evaluate flow at rest and with distal augmentation maneuvers in the common femoral, femoral and popliteal veins.  COMPARISON: None.  FINDINGS: Contralateral Common Femoral Vein: Respiratory phasicity is normal and symmetric with the symptomatic side. No evidence of thrombus. Normal compressibility.  Common Femoral Vein: No evidence of thrombus.  Saphenofemoral Junction: No evidence of thrombus.  Profunda Femoral Vein: No evidence of thrombus.  Femoral  Vein: No evidence of thrombus.  Popliteal Vein: No evidence of thrombus.  Calf Veins (Posterior tibial): No evidence of thrombus.  IMPRESSION: No evidence of right lower extremity DVT.   Electronically Signed By: Marnee Spring M.D. On: 05/27/2015 04:49          DG Knee Complete 4 Views Right (Final result) Result time: 05/27/15 03:30:11   Final result by Rad Results In Interface (05/27/15 03:30:11)   Narrative:   CLINICAL DATA: Atraumatic right knee pain and swelling.  EXAM: RIGHT KNEE - COMPLETE 4+ VIEW  COMPARISON: Right knee radiographs 01/02/2015  FINDINGS: Moderate joint effusion, similar prior exam. Mild tricompartmental osteoarthritis, most significant in the patellofemoral compartment with subchondral change, stable from prior. The may be minimal chondrocalcinosis in the medial tibial femoral compartment. No bony destructive change. Atherosclerotic calcifications. Diffuse soft tissue edema.  IMPRESSION: Moderate joint effusion and diffuse soft tissue edema.  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 No acute bony abnormality.        I personally reviewed the radiologic studies   Procedures:  After discussion with the patient elected to have some drainage of his joint effusion for symptomatic relief. I felt was not necessarily a diagnostic tap at this point as this either osteoarthritis versus gouty arthritis. He does not appear to have clinically a septic joint at this time and is afebrile with a normal white blood cell count. The patient verbally consented for the procedure. Patient had lidocaine injected over the  superior lateral surface of his right patella. The patient had further injection lidocaine toward the right knee joint and then had penetration of the knee capsule with drainage of approximately 25 mL of serosanguineous drainage with symptomatic improvement. The fluid was nonpurulent in  appearance.    ED Course: * Patient's stay was uneventful and again was able to flex his knee and felt symptomatically improved. Patient be discharged with a prescription for prednisone for what seems to be some inflammatory arthritis again either osteoarthritis versus gout. Patient's uric acid level is slightly elevated he does have a history of gouty arthritis. His Doppler study shows no findings indicative of deep venous thrombosis and x-ray show no musculoskeletal significant abnormalities.    Assessment:  Acute nontraumatic right knee pain and swelling     Plan:  Outpatient management Patient was advised to return immediately if condition worsens. Patient was advised to follow up with their primary care physician or other specialized physicians involved in their outpatient care             Jennye Moccasin, MD 05/27/15 [phone]

## 2015-05-27 NOTE — ED Notes (Signed)
Patient with complaint orf right knee pain and swelling that Thursday evening. Patient reports that the pain has become worse. Denies any injury.

## 2015-05-27 NOTE — ED Notes (Signed)
Patient transported to Ultrasound 

## 2015-05-27 NOTE — Discharge Instructions (Signed)
Joint Pain Joint pain, which is also called arthralgia, can be caused by many things. Joint pain often goes away when you follow your health care provider's instructions for relieving pain at home. However, joint pain can also be caused by conditions that require further treatment. Common causes of joint pain include:  Bruising in the area of the joint.  Overuse of the joint.  Wear and tear on the joints that occur with aging (osteoarthritis).  Various other forms of arthritis.  A buildup of a crystal form of uric acid in the joint (gout).  Infections of the joint (septic arthritis) or of the bone (osteomyelitis). Your health care provider may recommend medicine to help with the pain. If your joint pain continues, additional tests may be needed to diagnose your condition. HOME CARE INSTRUCTIONS Watch your condition for any changes. Follow these instructions as directed to lessen the pain that you are feeling.  Take medicines only as directed by your health care provider.  Rest the affected area for as long as your health care provider says that you should. If directed to do so, raise the painful joint above the level of your heart while you are sitting or lying down.  Do not do things that cause or worsen pain.  If directed, apply ice to the painful area:  Put ice in a plastic bag.  Place a towel between your skin and the bag.  Leave the ice on for 20 minutes, 2-3 times per day.  Wear an elastic bandage, splint, or sling as directed by your health care provider. Loosen the elastic bandage or splint if your fingers or toes become numb and tingle, or if they turn cold and blue.  Begin exercising or stretching the affected area as directed by your health care provider. Ask your health care provider what types of exercise are safe for you.  Keep all follow-up visits as directed by your health care provider. This is important. SEEK MEDICAL CARE IF:  Your pain increases, and medicine  does not help.  Your joint pain does not improve within 3 days.  You have increased bruising or swelling.  You have a fever.  You lose 10 lb (4.5 kg) or more without trying. SEEK IMMEDIATE MEDICAL CARE IF:  You are not able to move the joint.  Your fingers or toes become numb or they turn cold and blue.   This information is not intended to replace advice given to you by your health care provider. Make sure you discuss any questions you have with your health care provider.   Document Released: 04/07/2005 Document Revised: 04/28/2014 Document Reviewed: 01/17/2014 Elsevier Interactive Patient Education [employer]  Please return immediately if condition worsens. Please contact her primary physician or the physician you were given for referral. If you have any specialist physicians involved in her treatment and plan please also contact them. Thank you for using [place] [facility].

## 2015-06-03 ENCOUNTER — Emergency Department: Payer: Medicare Other

## 2015-06-03 ENCOUNTER — Encounter: Payer: Self-pay | Admitting: Emergency Medicine

## 2015-06-03 ENCOUNTER — Emergency Department
Admission: EM | Admit: 2015-06-03 | Discharge: 2015-06-03 | Disposition: A | Discharge status: Home / Self Care | Payer: Medicare Other | Attending: Emergency Medicine | Admitting: Emergency Medicine

## 2015-06-03 DIAGNOSIS — R600 Localized edema: Secondary | ICD-10-CM

## 2015-06-03 DIAGNOSIS — Z7952 Long term (current) use of systemic steroids: Secondary | ICD-10-CM | POA: Diagnosis not present

## 2015-06-03 DIAGNOSIS — Z87891 Personal history of nicotine dependence: Secondary | ICD-10-CM | POA: Insufficient documentation

## 2015-06-03 DIAGNOSIS — R339 Retention of urine, unspecified: Secondary | ICD-10-CM | POA: Diagnosis not present

## 2015-06-03 DIAGNOSIS — Z79899 Other long term (current) drug therapy: Secondary | ICD-10-CM | POA: Diagnosis not present

## 2015-06-03 DIAGNOSIS — I509 Heart failure, unspecified: Secondary | ICD-10-CM | POA: Diagnosis not present

## 2015-06-03 DIAGNOSIS — I10 Essential (primary) hypertension: Secondary | ICD-10-CM | POA: Diagnosis not present

## 2015-06-03 DIAGNOSIS — Z880 Allergy status to penicillin: Secondary | ICD-10-CM | POA: Insufficient documentation

## 2015-06-03 DIAGNOSIS — R338 Other retention of urine: Secondary | ICD-10-CM

## 2015-06-03 DIAGNOSIS — R2241 Localized swelling, mass and lump, right lower limb: Secondary | ICD-10-CM | POA: Diagnosis present

## 2015-06-03 LAB — URINALYSIS COMPLETE WITH MICROSCOPIC (ARMC ONLY)
BACTERIA UA: NONE SEEN
BILIRUBIN URINE: NEGATIVE
Glucose, UA: NEGATIVE mg/dL
KETONES UR: NEGATIVE mg/dL
LEUKOCYTES UA: NEGATIVE
NITRITE: NEGATIVE
PH: 5 (ref 5.0–8.0)
PROTEIN: 100 mg/dL — AB
SPECIFIC GRAVITY, URINE: 1.02 (ref 1.005–1.030)

## 2015-06-03 LAB — COMPREHENSIVE METABOLIC PANEL
ALT: 17 U/L (ref 17–63)
ANION GAP: 10 (ref 5–15)
AST: 15 U/L (ref 15–41)
Albumin: 3.5 g/dL (ref 3.5–5.0)
Alkaline Phosphatase: 58 U/L (ref 38–126)
BILIRUBIN TOTAL: 0.9 mg/dL (ref 0.3–1.2)
BUN: 37 mg/dL — ABNORMAL HIGH (ref 6–20)
CO2: 24 mmol/L (ref 22–32)
Calcium: 9.1 mg/dL (ref 8.9–10.3)
Chloride: 106 mmol/L (ref 101–111)
Creatinine, Ser: 1.65 mg/dL — ABNORMAL HIGH (ref 0.61–1.24)
GFR calc Af Amer: 48 mL/min — ABNORMAL LOW (ref >60)
GFR, EST NON AFRICAN AMERICAN: 41 mL/min — AB (ref >60)
Glucose, Bld: 111 mg/dL — ABNORMAL HIGH (ref 65–99)
POTASSIUM: 4.8 mmol/L (ref 3.5–5.1)
Sodium: 140 mmol/L (ref 135–145)
TOTAL PROTEIN: 8.5 g/dL — AB (ref 6.5–8.1)

## 2015-06-03 LAB — BRAIN NATRIURETIC PEPTIDE: B Natriuretic Peptide: 907 pg/mL — ABNORMAL HIGH (ref 0.0–100.0)

## 2015-06-03 LAB — CBC WITH DIFFERENTIAL/PLATELET
BASOS ABS: 0 10*3/uL (ref 0–0.1)
BASOS PCT: 0 %
Eosinophils Absolute: 0 10*3/uL (ref 0–0.7)
Eosinophils Relative: 0 %
HEMATOCRIT: 48.1 % (ref 40.0–52.0)
HEMOGLOBIN: 15.3 g/dL (ref 13.0–18.0)
LYMPHS PCT: 7 %
Lymphs Abs: 0.9 10*3/uL — ABNORMAL LOW (ref 1.0–3.6)
MCH: 28.8 pg (ref 26.0–34.0)
MCHC: 31.8 g/dL — ABNORMAL LOW (ref 32.0–36.0)
MCV: 90.6 fL (ref 80.0–100.0)
MONO ABS: 1.7 10*3/uL — AB (ref 0.2–1.0)
Monocytes Relative: 12 %
NEUTROS ABS: 11.6 10*3/uL — AB (ref 1.4–6.5)
NEUTROS PCT: 81 %
Platelets: 453 10*3/uL — ABNORMAL HIGH (ref 150–440)
RBC: 5.31 MIL/uL (ref 4.40–5.90)
RDW: 17 % — ABNORMAL HIGH (ref 11.5–14.5)
WBC: 14.3 10*3/uL — AB (ref 3.8–10.6)

## 2015-06-03 MED ORDER — FUROSEMIDE 80 MG PO TABS: 80.0000 mg | ORAL_TABLET | Freq: Two times a day (BID) | ORAL | Status: DC

## 2015-06-03 NOTE — ED Provider Notes (Signed)
[facility] Emergency Department Provider Note ____________________________________________  Time seen: 8  I have reviewed the triage vital signs and the nursing notes.  HISTORY  Chief Complaint  Knee Pain  HPI COBEN GODSHALL is a 65 y.o. male presents to the ED via EMS from home for evaluation of left greater than right leg pain. The patient has a history of COPD, hypertension, CHF, and gout. He was evaluated here about a week prior for sudden onset of nontraumatic right knee swelling and effusion. He was treated with a therapeutic arthrocentesis and discharged with prednisone to dose as directed. He describes completed the medication as directed. He recalls that within 2 days of being evaluated for the right knee he began to note increased tightness to the left knee. He denies any significant shortness of breath, chest pain, or cough. He does admit that he has apparently overlooked the fact that he is missed his fluid by mouth and his blood pressure medicine for an unknown duration. He also endorses an inability to void over the last 48 hours. He describes having the urge to go but has not noted "even a drop" of urine when he attempts to void.He denies any significant pain or history of urinary retention on intake.  Past Medical History  Diagnosis Date  . Hypertension   . CHF (congestive heart failure) (HCC)   . COPD (chronic obstructive pulmonary disease) (HCC)   . Diverticulitis   . Irregular heart beat   . Gout     Patient Active Problem List   Diagnosis Date Noted  . CHF (congestive heart failure) (HCC) 08/31/2014    Past Surgical History  Procedure Laterality Date  . Cardiac surgery      Current Outpatient Rx  Name  Route  Sig  Dispense  Refill  . carvedilol (COREG) 12.5 MG tablet   Oral   Take 3 tablets (37.5 mg total) by mouth 2 (two) times daily with a meal. Patient not taking: Reported on 04/24/2015   180 tablet   0   . carvedilol  (COREG) 6.25 MG tablet   Oral   Take 6.25 mg by mouth 2 (two) times daily.         . colchicine (COLCRYS) 0.6 MG tablet   Oral   Take 0.6 mg by mouth daily.         . famotidine (PEPCID) 20 MG tablet   Oral   Take 1 tablet (20 mg total) by mouth 2 (two) times daily. Patient not taking: Reported on 04/24/2015   60 tablet   0   . fluticasone-salmeterol (ADVAIR HFA) 115-21 MCG/ACT inhaler   Inhalation   Inhale 2 puffs into the lungs 2 (two) times daily. Patient not taking: Reported on 04/24/2015   1 Inhaler   12   . gabapentin (NEURONTIN) 300 MG capsule   Oral   Take 1 capsule (300 mg total) by mouth 2 (two) times daily.   60 capsule   0   . HYDROcodone-acetaminophen (NORCO) 5-325 MG per tablet   Oral   Take 1-2 tablets by mouth every 4 (four) hours as needed for moderate pain. Patient not taking: Reported on 04/24/2015   15 tablet   0   . Ipratropium-Albuterol (COMBIVENT) 20-100 MCG/ACT AERS respimat   Inhalation   Inhale 2 puffs into the lungs 4 (four) times daily.          Marland Kitchen lisinopril (PRINIVIL,ZESTRIL) 20 MG tablet   Oral   Take 20 mg by  mouth daily.         . Melatonin 3 MG TABS   Oral   Take 3 mg by mouth at bedtime as needed (for sleep).         . metoprolol tartrate (LOPRESSOR) 25 MG tablet   Oral   Take 6.25 mg by mouth 4 (four) times daily.         . naproxen (NAPROSYN) 500 MG tablet   Oral   Take 1 tablet (500 mg total) by mouth 2 (two) times daily with a meal. Patient not taking: Reported on 04/24/2015   60 tablet   0   . oxyCODONE-acetaminophen (ROXICET) 5-325 MG tablet   Oral   Take 1 tablet by mouth every 6 (six) hours as needed.   14 tablet   0   . pravastatin (PRAVACHOL) 20 MG tablet   Oral   Take 20 mg by mouth every morning.          . predniSONE (DELTASONE) 20 MG tablet   Oral   Take 1 tablet (20 mg total) by mouth daily.   5 tablet   0   . spironolactone (ALDACTONE) 25 MG tablet   Oral   Take 12.5 mg by mouth 2 (two)  times daily.         Marland Kitchen tiotropium (SPIRIVA) 18 MCG inhalation capsule   Inhalation   Place 18 mcg into inhaler and inhale daily.         Marland Kitchen torsemide (DEMADEX) 20 MG tablet   Oral   Take 20 mg by mouth daily.          Allergies Penicillins  History reviewed. No pertinent family history.  Social History Social History  Substance Use Topics  . Smoking status: Former Games developer  . Smokeless tobacco: Never Used  . Alcohol Use: No   Review of Systems  Constitutional: Negative for fever. Eyes: Negative for visual changes. ENT: Negative for sore throat. Cardiovascular: Negative for chest pain. Bilateral leg swelling Respiratory: Negative for shortness of breath. Gastrointestinal: Negative for abdominal pain, vomiting and diarrhea. Genitourinary: Negative for dysuria. Urinary retention. Musculoskeletal: Negative for back pain. Skin: Negative for rash. Neurological: Negative for headaches, focal weakness or numbness. ____________________________________________  PHYSICAL EXAM:  VITAL SIGNS: ED Triage Vitals  Enc Vitals Group     BP 06/03/15 1111 124/70 mmHg     Pulse Rate 06/03/15 1111 106     Resp 06/03/15 1111 18     Temp 06/03/15 1111 97.8 F (36.6 C)     Temp Source 06/03/15 1111 Oral     SpO2 06/03/15 1111 96 %     Weight 06/03/15 1111 224 lb (101.606 kg)     Height 06/03/15 1111  (1.753 m)     Head Cir --      Peak Flow --      Pain Score 06/03/15 1112 0     Pain Loc --      Pain Edu? --      Excl. in GC? --    Constitutional: Alert and oriented. Well appearing and in no distress. Head: Normocephalic and atraumatic.      Eyes: Conjunctivae are normal. PERRL. Normal extraocular movements      Ears: Canals clear. TMs intact bilaterally.   Nose: No congestion/rhinorrhea.   Mouth/Throat: Mucous membranes are moist.   Neck: Supple. No thyromegaly. Hematological/Lymphatic/Immunological: No cervical lymphadenopathy. Cardiovascular: Tachy rate,  irregular rhythm. Normal distal pulses bilaterally.  Respiratory: Normal respiratory effort. No wheezes/rales. Mild  rhonchi. Gastrointestinal: Soft and nontender. No distention, rebound, guarding, or organomegaly. Musculoskeletal: Nontender with normal range of motion in all extremities.  Neurologic:  Normal gait without ataxia. Normal speech and language. No gross focal neurologic deficits are appreciated. Skin:  Skin is warm, dry and intact. No rash noted. 2+ pitting edema noted bilaterally to the LEs from the feet to the knees.  Psychiatric: Mood and affect are normal. Patient exhibits appropriate insight and judgment. ____________________________________________   LABS (pertinent positives/negatives) Labs Reviewed  COMPREHENSIVE METABOLIC PANEL - Abnormal; Notable for the following:    Glucose, Bld 111 (*)    BUN 37 (*)    Creatinine, Ser 1.65 (*)    Total Protein 8.5 (*)    GFR calc non Af Amer 41 (*)    GFR calc Af Amer 48 (*)    All other components within normal limits  CBC WITH DIFFERENTIAL/PLATELET - Abnormal; Notable for the following:    WBC 14.3 (*)    MCHC 31.8 (*)    RDW 17.0 (*)    Platelets 453 (*)    Neutro Abs 11.6 (*)    Lymphs Abs 0.9 (*)    Monocytes Absolute 1.7 (*)    All other components within normal limits  BRAIN NATRIURETIC PEPTIDE - Abnormal; Notable for the following:    B Natriuretic Peptide 907.0 (*)    All other components within normal limits  ____________________________________________  EKG pending ____________________________________________   RADIOLOGY CXR IMPRESSION: Chronic diffuse nodular interstitial disease. Suspect noncardiogenic etiology, although mild chronic congestive heart failure cannot be excluded. There is stable cardiomegaly. No consolidation. No adenopathy. ____________________________________________  PROCEDURES  Bladder Scan Foley Cath ____________________________________________  INITIAL IMPRESSION /  ASSESSMENT AND PLAN / ED COURSE  Patient with acute BLE edema secondary to CHF and urinary retention. He will be transferred to my attending for ongoing medical management and admission to the inpatient service.  ____________________________________________  FINAL CLINICAL IMPRESSION(S) / ED DIAGNOSES  Final diagnoses:  Congestive heart failure, unspecified congestive heart failure chronicity, unspecified congestive heart failure type (HCC)  Acute urinary retention  Bilateral edema of lower extremity      Lissa Hoard, PA-C 06/03/15 1458  Lissa Hoard, PA-C 06/03/15 1525  Sharman Cheek, MD 06/03/15 1626

## 2015-06-03 NOTE — ED Notes (Signed)
Bladder Scan performed >999 indicated

## 2015-06-03 NOTE — ED Notes (Signed)
Pt arrived via EMS from home. 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  Pt has recent right knee and leg pain and was seen here last week. Pt states he had to get fluid drained off of his right knee before. Pain is now in his left knee which started about 2 days after the right knee was evaluated.  Pt given prescription for prednisone which he took as prescribed. No pain currently, but does c/o tightness.  When ambulating pain is 8/10.

## 2015-06-03 NOTE — ED Notes (Signed)
Pt verbalized understanding of discharge instructions and the need to follow up with his primary for catheter dc. NAD at this time.

## 2015-06-03 NOTE — ED Notes (Signed)
Leg Bag placed per Dr Charmian Muff initial eval and confirmed by Dr. Derrill Kay.

## 2015-06-03 NOTE — Discharge Instructions (Signed)
Continue all home medications.  Acute Urinary Retention, Male Acute urinary retention is the temporary inability to urinate. This is a common problem in older men. As men age their prostates become larger and block the flow of urine from the bladder. This is usually a problem that has come on gradually.  HOME CARE INSTRUCTIONS If you are sent home with a Foley catheter and a drainage system, you will need to discuss the best course of action with your health care provider. While the catheter is in, maintain a good intake of fluids. Keep the drainage bag emptied and lower than your catheter. This is so that contaminated urine will not flow back into your bladder, which could lead to a urinary tract infection. There are two main types of drainage bags. One is a large bag that usually is used at night. It has a good capacity that will allow you to sleep through the night without having to empty it. The second type is called a leg bag. It has a smaller capacity, so it needs to be emptied more frequently. However, the main advantage is that it can be attached by a leg strap and can go underneath your clothing, allowing you the freedom to move about or leave your home. Only take over-the-counter or prescription medicines for pain, discomfort, or fever as directed by your health care provider.  SEEK MEDICAL CARE IF:  You develop a low-grade fever.  You experience spasms or leakage of urine with the spasms. SEEK IMMEDIATE MEDICAL CARE IF:   You develop chills or fever.  Your catheter stops draining urine.  Your catheter falls out.  You start to develop increased bleeding that does not respond to rest and increased fluid intake. MAKE SURE YOU:  Understand these instructions.  Will watch your condition.  Will get help right away if you are not doing well or get worse.   This information is not intended to replace advice given to you by your health care provider. Make sure you discuss any  questions you have with your health care provider.   Document Released: 07/14/2000 Document Revised: 08/22/2014 Document Reviewed: 09/16/2012 Elsevier Interactive Patient Education 2016 [employer]  Peripheral Edema You have swelling in your legs (peripheral edema). This swelling is due to excess accumulation of salt and water in your body. Edema may be a sign of heart, kidney or liver disease, or a side effect of a medication. It may also be due to problems in the leg veins. Elevating your legs and using special support stockings may be very helpful, if the cause of the swelling is due to poor venous circulation. Avoid long periods of standing, whatever the cause. Treatment of edema depends on identifying the cause. Chips, pretzels, pickles and other salty foods should be avoided. Restricting salt in your diet is almost always needed. Water pills (diuretics) are often used to remove the excess salt and water from your body via urine. These medicines prevent the kidney from reabsorbing sodium. This increases urine flow. Diuretic treatment may also result in lowering of potassium levels in your body. Potassium supplements may be needed if you have to use diuretics daily. Daily weights can help you keep track of your progress in clearing your edema. You should call your caregiver for follow up care as recommended. SEEK IMMEDIATE MEDICAL CARE IF:   You have increased swelling, pain, redness, or heat in your legs.  You develop shortness of breath, especially when lying down.  You develop chest or abdominal  pain, weakness, or fainting.  You have a fever.   This information is not intended to replace advice given to you by your health care provider. Make sure you discuss any questions you have with your health care provider.   Document Released: 05/15/2004 Document Revised: 06/30/2011 Document Reviewed: 10/18/2014 Elsevier Interactive Patient Education [employer]

## 2015-06-03 NOTE — ED Notes (Signed)
Pt has been without his fluid pill for about a week now.  Bilateral LE pitting edema.

## 2015-06-03 NOTE — ED Notes (Signed)
Patient transported to X-ray 

## 2015-06-11 ENCOUNTER — Inpatient Hospital Stay
Admission: EM | Admit: 2015-06-11 | Discharge: 2015-06-16 | DRG: 291 | Disposition: A | Discharge status: Skilled Nursing Facility | Payer: Medicare Other | Attending: Specialist | Admitting: Specialist

## 2015-06-11 ENCOUNTER — Emergency Department: Payer: Medicare Other

## 2015-06-11 DIAGNOSIS — N200 Calculus of kidney: Secondary | ICD-10-CM | POA: Diagnosis present

## 2015-06-11 DIAGNOSIS — Z79899 Other long term (current) drug therapy: Secondary | ICD-10-CM

## 2015-06-11 DIAGNOSIS — I5023 Acute on chronic systolic (congestive) heart failure: Secondary | ICD-10-CM | POA: Diagnosis present

## 2015-06-11 DIAGNOSIS — N183 Chronic kidney disease, stage 3 (moderate): Secondary | ICD-10-CM | POA: Diagnosis present

## 2015-06-11 DIAGNOSIS — L8990 Pressure ulcer of unspecified site, unspecified stage: Secondary | ICD-10-CM | POA: Insufficient documentation

## 2015-06-11 DIAGNOSIS — Z7952 Long term (current) use of systemic steroids: Secondary | ICD-10-CM

## 2015-06-11 DIAGNOSIS — Z7951 Long term (current) use of inhaled steroids: Secondary | ICD-10-CM

## 2015-06-11 DIAGNOSIS — Z79891 Long term (current) use of opiate analgesic: Secondary | ICD-10-CM

## 2015-06-11 DIAGNOSIS — B9689 Other specified bacterial agents as the cause of diseases classified elsewhere: Secondary | ICD-10-CM | POA: Diagnosis present

## 2015-06-11 DIAGNOSIS — J441 Chronic obstructive pulmonary disease with (acute) exacerbation: Secondary | ICD-10-CM | POA: Diagnosis present

## 2015-06-11 DIAGNOSIS — I9589 Other hypotension: Secondary | ICD-10-CM

## 2015-06-11 DIAGNOSIS — J449 Chronic obstructive pulmonary disease, unspecified: Secondary | ICD-10-CM | POA: Diagnosis present

## 2015-06-11 DIAGNOSIS — R778 Other specified abnormalities of plasma proteins: Secondary | ICD-10-CM

## 2015-06-11 DIAGNOSIS — N179 Acute kidney failure, unspecified: Secondary | ICD-10-CM | POA: Diagnosis present

## 2015-06-11 DIAGNOSIS — I482 Chronic atrial fibrillation: Secondary | ICD-10-CM | POA: Diagnosis present

## 2015-06-11 DIAGNOSIS — I959 Hypotension, unspecified: Secondary | ICD-10-CM | POA: Diagnosis present

## 2015-06-11 DIAGNOSIS — M21372 Foot drop, left foot: Secondary | ICD-10-CM | POA: Diagnosis present

## 2015-06-11 DIAGNOSIS — I420 Dilated cardiomyopathy: Secondary | ICD-10-CM | POA: Diagnosis present

## 2015-06-11 DIAGNOSIS — I4891 Unspecified atrial fibrillation: Secondary | ICD-10-CM | POA: Diagnosis present

## 2015-06-11 DIAGNOSIS — F0390 Unspecified dementia without behavioral disturbance: Secondary | ICD-10-CM | POA: Diagnosis present

## 2015-06-11 DIAGNOSIS — I509 Heart failure, unspecified: Secondary | ICD-10-CM | POA: Diagnosis not present

## 2015-06-11 DIAGNOSIS — R7989 Other specified abnormal findings of blood chemistry: Secondary | ICD-10-CM

## 2015-06-11 DIAGNOSIS — M47896 Other spondylosis, lumbar region: Secondary | ICD-10-CM | POA: Diagnosis present

## 2015-06-11 DIAGNOSIS — M109 Gout, unspecified: Secondary | ICD-10-CM | POA: Diagnosis present

## 2015-06-11 DIAGNOSIS — I130 Hypertensive heart and chronic kidney disease with heart failure and stage 1 through stage 4 chronic kidney disease, or unspecified chronic kidney disease: Secondary | ICD-10-CM | POA: Diagnosis not present

## 2015-06-11 DIAGNOSIS — R319 Hematuria, unspecified: Secondary | ICD-10-CM | POA: Diagnosis present

## 2015-06-11 DIAGNOSIS — Z87891 Personal history of nicotine dependence: Secondary | ICD-10-CM

## 2015-06-11 DIAGNOSIS — Z880 Allergy status to penicillin: Secondary | ICD-10-CM

## 2015-06-11 DIAGNOSIS — J9601 Acute respiratory failure with hypoxia: Secondary | ICD-10-CM | POA: Diagnosis present

## 2015-06-11 DIAGNOSIS — M130 Polyarthritis, unspecified: Secondary | ICD-10-CM | POA: Diagnosis present

## 2015-06-11 DIAGNOSIS — R29898 Other symptoms and signs involving the musculoskeletal system: Secondary | ICD-10-CM | POA: Insufficient documentation

## 2015-06-11 DIAGNOSIS — R52 Pain, unspecified: Secondary | ICD-10-CM

## 2015-06-11 DIAGNOSIS — N390 Urinary tract infection, site not specified: Secondary | ICD-10-CM | POA: Diagnosis present

## 2015-06-11 LAB — BASIC METABOLIC PANEL
ANION GAP: 9 (ref 5–15)
BUN: 32 mg/dL — ABNORMAL HIGH (ref 6–20)
CALCIUM: 8.3 mg/dL — AB (ref 8.9–10.3)
CO2: 26 mmol/L (ref 22–32)
Chloride: 102 mmol/L (ref 101–111)
Creatinine, Ser: 1.94 mg/dL — ABNORMAL HIGH (ref 0.61–1.24)
GFR, EST AFRICAN AMERICAN: 39 mL/min — AB (ref >60)
GFR, EST NON AFRICAN AMERICAN: 34 mL/min — AB (ref >60)
Glucose, Bld: 98 mg/dL (ref 65–99)
POTASSIUM: 3.8 mmol/L (ref 3.5–5.1)
SODIUM: 137 mmol/L (ref 135–145)

## 2015-06-11 LAB — CBC WITH DIFFERENTIAL/PLATELET
Basophils Absolute: 0.1 10*3/uL (ref 0–0.1)
Basophils Relative: 0 %
EOS ABS: 0.1 10*3/uL (ref 0–0.7)
HCT: 38.7 % — ABNORMAL LOW (ref 40.0–52.0)
Hemoglobin: 12.2 g/dL — ABNORMAL LOW (ref 13.0–18.0)
LYMPHS ABS: 1.8 10*3/uL (ref 1.0–3.6)
MCH: 28.3 pg (ref 26.0–34.0)
MCHC: 31.5 g/dL — AB (ref 32.0–36.0)
MCV: 89.8 fL (ref 80.0–100.0)
Monocytes Absolute: 2.2 10*3/uL — ABNORMAL HIGH (ref 0.2–1.0)
Monocytes Relative: 11 %
Neutro Abs: 16.2 10*3/uL — ABNORMAL HIGH (ref 1.4–6.5)
PLATELETS: 357 10*3/uL (ref 150–440)
RBC: 4.31 MIL/uL — ABNORMAL LOW (ref 4.40–5.90)
RDW: 16.7 % — AB (ref 11.5–14.5)
WBC: 20.3 10*3/uL — AB (ref 3.8–10.6)

## 2015-06-11 LAB — TROPONIN I
TROPONIN I: 0.03 ng/mL (ref <0.031)
TROPONIN I: 0.06 ng/mL — AB (ref <0.031)

## 2015-06-11 LAB — CBC
HEMATOCRIT: 39.2 % — AB (ref 40.0–52.0)
HEMOGLOBIN: 12.2 g/dL — AB (ref 13.0–18.0)
MCH: 28.2 pg (ref 26.0–34.0)
MCHC: 31.2 g/dL — AB (ref 32.0–36.0)
MCV: 90.5 fL (ref 80.0–100.0)
Platelets: 411 10*3/uL (ref 150–440)
RBC: 4.33 MIL/uL — ABNORMAL LOW (ref 4.40–5.90)
RDW: 16.9 % — ABNORMAL HIGH (ref 11.5–14.5)
WBC: 15 10*3/uL — AB (ref 3.8–10.6)

## 2015-06-11 LAB — URINALYSIS COMPLETE WITH MICROSCOPIC (ARMC ONLY)
BILIRUBIN URINE: NEGATIVE
GLUCOSE, UA: NEGATIVE mg/dL
KETONES UR: NEGATIVE mg/dL
NITRITE: NEGATIVE
PROTEIN: 100 mg/dL — AB
Specific Gravity, Urine: 1.014 (ref 1.005–1.030)
pH: 9 — ABNORMAL HIGH (ref 5.0–8.0)

## 2015-06-11 LAB — BRAIN NATRIURETIC PEPTIDE: B NATRIURETIC PEPTIDE 5: 963 pg/mL — AB (ref 0.0–100.0)

## 2015-06-11 LAB — HEPATIC FUNCTION PANEL
ALBUMIN: 2.4 g/dL — AB (ref 3.5–5.0)
ALT: 29 U/L (ref 17–63)
AST: 25 U/L (ref 15–41)
Alkaline Phosphatase: 51 U/L (ref 38–126)
BILIRUBIN DIRECT: 0.6 mg/dL — AB (ref 0.1–0.5)
BILIRUBIN TOTAL: 1.3 mg/dL — AB (ref 0.3–1.2)
Indirect Bilirubin: 0.7 mg/dL (ref 0.3–0.9)
Total Protein: 7 g/dL (ref 6.5–8.1)

## 2015-06-11 LAB — LACTIC ACID, PLASMA: LACTIC ACID, VENOUS: 1.8 mmol/L (ref 0.5–2.0)

## 2015-06-11 MED ORDER — DIGOXIN 0.25 MG/ML IJ SOLN
0.2500 mg | Freq: Once | INTRAMUSCULAR | Status: AC
  Administered 2015-06-11: 0.25 mg via INTRAVENOUS
  Filled 2015-06-11: qty 2

## 2015-06-11 MED ORDER — DIGOXIN 0.25 MG/ML IJ SOLN
0.2500 mg | Freq: Once | INTRAMUSCULAR | Status: AC
  Administered 2015-06-12: 0.25 mg via INTRAVENOUS
  Filled 2015-06-11: qty 2

## 2015-06-11 MED ORDER — SODIUM CHLORIDE 0.9 % IV SOLN
Freq: Once | INTRAVENOUS | Status: AC
  Administered 2015-06-11: 999 mL/h via INTRAVENOUS

## 2015-06-11 MED ORDER — METOPROLOL TARTRATE 1 MG/ML IV SOLN: 2.0000 mg | Freq: Once | INTRAVENOUS | Status: DC

## 2015-06-11 MED ORDER — MORPHINE SULFATE (PF) 2 MG/ML IV SOLN
2.0000 mg | Freq: Once | INTRAVENOUS | Status: AC
  Administered 2015-06-11: 2 mg via INTRAVENOUS
  Filled 2015-06-11: qty 1

## 2015-06-11 MED ORDER — SODIUM CHLORIDE 0.9 % IV BOLUS (SEPSIS)
250.0000 mL | Freq: Once | INTRAVENOUS | Status: AC
  Administered 2015-06-11: 250 mL via INTRAVENOUS

## 2015-06-11 MED ORDER — ASPIRIN 81 MG PO CHEW
324.0000 mg | CHEWABLE_TABLET | Freq: Once | ORAL | Status: AC
  Administered 2015-06-12: 324 mg via ORAL
  Filled 2015-06-11: qty 4

## 2015-06-11 NOTE — ED Notes (Signed)
MD notified of pt heart rate in 140s. Awaiting new orders by MD.

## 2015-06-11 NOTE — ED Notes (Signed)
Pt to Xray.

## 2015-06-11 NOTE — ED Notes (Addendum)
Pt presents to ED via EMS c/o bilateral edema of lower legs. Per EMS pt discharged from hospital 2 weeks ago, and patient has not been properly cared for at home due to his leg pain. Pt reported to EMS that he has been sitting on the couch since being discharged.

## 2015-06-11 NOTE — ED Provider Notes (Addendum)
[facility] Emergency Department Provider Note  ____________________________________________  Time seen: Approximately 8:41 PM  I have reviewed the triage vital signs and the nursing notes.   HISTORY  Chief Complaint Leg Swelling    HPI George Hopkins is a 65 y.o. male patient reports his legs been swelling more. He was recently discharged from the hospital was sitting on the couch without getting his legs up. Since his legs up and hurting. Affected Toby now his legs are numb. Patient reports she's not really short of breath at all. Feels otherwise okay.   Past Medical History  Diagnosis Date  . Hypertension   . CHF (congestive heart failure) (HCC)   . COPD (chronic obstructive pulmonary disease) (HCC)   . Diverticulitis   . Irregular heart beat   . Gout     Patient Active Problem List   Diagnosis Date Noted  . CHF (congestive heart failure) (HCC) 08/31/2014    Past Surgical History  Procedure Laterality Date  . Cardiac surgery      Current Outpatient Rx  Name  Route  Sig  Dispense  Refill  . carvedilol (COREG) 6.25 MG tablet   Oral   Take 6.25 mg by mouth 2 (two) times daily.         . colchicine (COLCRYS) 0.6 MG tablet   Oral   Take 0.6 mg by mouth daily.         . furosemide (LASIX) 80 MG tablet   Oral   Take 1 tablet (80 mg total) by mouth 2 (two) times daily.   60 tablet   0   . gabapentin (NEURONTIN) 300 MG capsule   Oral   Take 1 capsule (300 mg total) by mouth 2 (two) times daily.   60 capsule   0   . Ipratropium-Albuterol (COMBIVENT) 20-100 MCG/ACT AERS respimat   Inhalation   Inhale 1 puff into the lungs every 6 (six) hours as needed for wheezing or shortness of breath.          . lisinopril (PRINIVIL,ZESTRIL) 20 MG tablet   Oral   Take 20 mg by mouth daily.         . Melatonin 3 MG TABS   Oral   Take 3 mg by mouth at bedtime as needed (for sleep).         . metoprolol tartrate (LOPRESSOR) 25 MG  tablet   Oral   Take 6.25 mg by mouth every 6 (six) hours.          Marland Kitchen oxyCODONE-acetaminophen (PERCOCET/ROXICET) 5-325 MG tablet   Oral   Take 1 tablet by mouth every 6 (six) hours as needed for severe pain.         . pravastatin (PRAVACHOL) 20 MG tablet   Oral   Take 20 mg by mouth at bedtime.          Marland Kitchen spironolactone (ALDACTONE) 25 MG tablet   Oral   Take 12.5 mg by mouth 2 (two) times daily.         Marland Kitchen tiotropium (SPIRIVA) 18 MCG inhalation capsule   Inhalation   Place 18 mcg into inhaler and inhale daily.         Marland Kitchen torsemide (DEMADEX) 20 MG tablet   Oral   Take 20 mg by mouth daily.         Marland Kitchen HYDROcodone-acetaminophen (NORCO) 5-325 MG per tablet   Oral   Take 1-2 tablets by mouth every 4 (four) hours as needed for  moderate pain. Patient not taking: Reported on 04/24/2015   15 tablet   0   . naproxen (NAPROSYN) 500 MG tablet   Oral   Take 1 tablet (500 mg total) by mouth 2 (two) times daily with a meal. Patient not taking: Reported on 04/24/2015   60 tablet   0   . predniSONE (DELTASONE) 20 MG tablet   Oral   Take 1 tablet (20 mg total) by mouth daily. Patient not taking: Reported on 06/11/2015   5 tablet   0     Allergies Penicillins  History reviewed. No pertinent family history.  Social History Social History  Substance Use Topics  . Smoking status: Former Games developer  . Smokeless tobacco: Never Used  . Alcohol Use: No    Review of Systems Constitutional: No fever/chills Eyes: No visual changes. ENT: No sore throat. Cardiovascular: Denies chest pain. Respiratory: Slight shortness of breath. Gastrointestinal: No abdominal pain.  No nausea, no vomiting.  No diarrhea.  No constipation. Genitourinary: Negative for dysuria. Musculoskeletal: Negative for back pain. Skin: Negative for rash.  10-point ROS otherwise negative.  ____________________________________________   PHYSICAL EXAM:  VITAL SIGNS: ED Triage Vitals  Enc Vitals Group      BP 06/11/15 1811 101/68 mmHg     Pulse Rate 06/11/15 1811 138     Resp 06/11/15 1811 20     Temp 06/11/15 1811 97.7 F (36.5 C)     Temp Source 06/11/15 1811 Oral     SpO2 06/11/15 1804 100 %     Weight 06/11/15 1811 221 lb (100.245 kg)     Height 06/11/15 1811 5\' 11"  (1.803 m)     Head Cir --      Peak Flow --      Pain Score --      Pain Loc --      Pain Edu? --      Excl. in GC? --     Constitutional: Alert and oriented. Well appearing and in no acute distress. Eyes: Conjunctivae are normal. PERRL. EOMI. Head: Atraumatic. Nose: No congestion/rhinnorhea. Mouth/Throat: Mucous membranes are moist.  Oropharynx non-erythematous. Neck: No stridor.  Cardiovascular: Tachycardia irregularly irregular rhythm. Grossly normal heart sounds.  Good peripheral circulation. Respiratory: Normal respiratory effort.  No retractions. Lungs CTAB. Urinary: Foley in place is not circumcised Gastrointestinal: Soft and nontender. No distention. No abdominal bruits. No CVA tenderness. Musculoskeletal: Marked bilateral lower extremity edema. The left foot is slightly red. Unable to tell for sure if there is any joint effusions. Patient reports his knees hurt. Neurologic:  Normal speech and language. No gross focal neurologic deficits are appreciated. No gait instability. Skin:  Skin is warm, dry and intact. No rash noted. Psychiatric: Mood and affect are normal. Speech and behavior are normal.  ____________________________________________   LABS (all labs ordered are listed, but only abnormal results are displayed)  Labs Reviewed  BASIC METABOLIC PANEL - Abnormal; Notable for the following:    BUN 32 (*)    Creatinine, Ser 1.94 (*)    Calcium 8.3 (*)    GFR calc non Af Amer 34 (*)    GFR calc Af Amer 39 (*)    All other components within normal limits  CBC - Abnormal; Notable for the following:    WBC 15.0 (*)    RBC 4.33 (*)    Hemoglobin 12.2 (*)    HCT 39.2 (*)    MCHC 31.2 (*)    RDW  16.9 (*)  All other components within normal limits  BRAIN NATRIURETIC PEPTIDE - Abnormal; Notable for the following:    B Natriuretic Peptide 963.0 (*)    All other components within normal limits  TROPONIN I - Abnormal; Notable for the following:    Troponin I 0.06 (*)    All other components within normal limits  HEPATIC FUNCTION PANEL - Abnormal; Notable for the following:    Albumin 2.4 (*)    Total Bilirubin 1.3 (*)    Bilirubin, Direct 0.6 (*)    All other components within normal limits  URINALYSIS COMPLETEWITH MICROSCOPIC ([facility] ONLY) - Abnormal; Notable for the following:    Color, Urine AMBER (*)    APPearance CLOUDY (*)    Hgb urine dipstick 1+ (*)    pH 9.0 (*)    Protein, ur 100 (*)    Leukocytes, UA 3+ (*)    Bacteria, UA RARE (*)    Squamous Epithelial / LPF 0-5 (*)    All other components within normal limits  URINE CULTURE  TROPONIN I  LACTIC ACID, PLASMA  LACTIC ACID, PLASMA  CBC WITH DIFFERENTIAL/PLATELET   ____________________________________________  EKG  EKG read and interpreted by me shows A. fib at 118 left axis R-wave rate related changes T's in lateral leads ____________________________________________  RADIOLOGY  X-ray read by radiology as cardiomegaly with mild increased markings ____________________________________________   PROCEDURES  Patient receives one bolus of 250 cc of normal saline his O2 sats are good and feels okay his lungs are clear. We will give him another 250 cc of normal saline Patient receives second bolus 250 cc normal saline and feels okay O2 sats are still good heart rate is coming down slightly with this and 0.25 mg of dig that he received. Give him 1 more bolus of 250 cc of normal saline and a half an hour which we 2 hours after the first dose of dig will give her another dose of 0.25 of dig hospitalists are aware  _Critical care time half an hour ___________________________________________   INITIAL IMPRESSION /  ASSESSMENT AND PLAN / ED COURSE  Pertinent labs & imaging results that were available during my care of the patient were reviewed by me and considered in my medical decision making (see chart for details).  Patient's blood pressure goes down her heart rate goes up. Patient does not have a fever. We'll give him a very small fluid boluses and see if that improves his heart rate and blood pressure. Repeating the CBC troponin adding a lactate. ____________________________________________   FINAL CLINICAL IMPRESSION(S) / ED DIAGNOSES  Final diagnoses:  Atrial fibrillation with rapid ventricular response (HCC)  Other specified hypotension  Elevated troponin      Arnaldo Natal, MD 06/11/15 2333  We now have no ICU beds at talk to Dr. Craige Cotta ICU doctor at [facility] he will accept the patient in transfer patient I think has early sepsis although are not sure. Since blood pressures better after the fluids heart rate is no 20s after the dig  Arnaldo Natal, MD 06/12/15 [phone]

## 2015-06-12 ENCOUNTER — Emergency Department: Payer: Medicare Other

## 2015-06-12 ENCOUNTER — Inpatient Hospital Stay
Admission: AD | Admit: 2015-06-12 | Payer: Self-pay | Source: Other Acute Inpatient Hospital | Admitting: Pulmonary Disease

## 2015-06-12 ENCOUNTER — Encounter: Payer: Self-pay | Admitting: Internal Medicine

## 2015-06-12 ENCOUNTER — Inpatient Hospital Stay
Admit: 2015-06-12 | Discharge: 2015-06-12 | Disposition: A | Discharge status: Home / Self Care | Payer: Medicare Other | Attending: Internal Medicine | Admitting: Internal Medicine

## 2015-06-12 DIAGNOSIS — I4891 Unspecified atrial fibrillation: Secondary | ICD-10-CM | POA: Diagnosis present

## 2015-06-12 DIAGNOSIS — I959 Hypotension, unspecified: Secondary | ICD-10-CM | POA: Diagnosis present

## 2015-06-12 DIAGNOSIS — R29898 Other symptoms and signs involving the musculoskeletal system: Secondary | ICD-10-CM | POA: Diagnosis not present

## 2015-06-12 DIAGNOSIS — N179 Acute kidney failure, unspecified: Secondary | ICD-10-CM | POA: Diagnosis present

## 2015-06-12 DIAGNOSIS — M47896 Other spondylosis, lumbar region: Secondary | ICD-10-CM | POA: Diagnosis present

## 2015-06-12 DIAGNOSIS — Z87891 Personal history of nicotine dependence: Secondary | ICD-10-CM | POA: Diagnosis not present

## 2015-06-12 DIAGNOSIS — Z79891 Long term (current) use of opiate analgesic: Secondary | ICD-10-CM | POA: Diagnosis not present

## 2015-06-12 DIAGNOSIS — N183 Chronic kidney disease, stage 3 (moderate): Secondary | ICD-10-CM | POA: Diagnosis present

## 2015-06-12 DIAGNOSIS — F0390 Unspecified dementia without behavioral disturbance: Secondary | ICD-10-CM | POA: Diagnosis present

## 2015-06-12 DIAGNOSIS — M130 Polyarthritis, unspecified: Secondary | ICD-10-CM | POA: Diagnosis present

## 2015-06-12 DIAGNOSIS — I420 Dilated cardiomyopathy: Secondary | ICD-10-CM | POA: Diagnosis present

## 2015-06-12 DIAGNOSIS — I130 Hypertensive heart and chronic kidney disease with heart failure and stage 1 through stage 4 chronic kidney disease, or unspecified chronic kidney disease: Secondary | ICD-10-CM | POA: Diagnosis present

## 2015-06-12 DIAGNOSIS — R319 Hematuria, unspecified: Secondary | ICD-10-CM | POA: Diagnosis present

## 2015-06-12 DIAGNOSIS — M109 Gout, unspecified: Secondary | ICD-10-CM | POA: Diagnosis present

## 2015-06-12 DIAGNOSIS — N390 Urinary tract infection, site not specified: Secondary | ICD-10-CM | POA: Diagnosis present

## 2015-06-12 DIAGNOSIS — Z79899 Other long term (current) drug therapy: Secondary | ICD-10-CM | POA: Diagnosis not present

## 2015-06-12 DIAGNOSIS — J449 Chronic obstructive pulmonary disease, unspecified: Secondary | ICD-10-CM | POA: Diagnosis present

## 2015-06-12 DIAGNOSIS — N200 Calculus of kidney: Secondary | ICD-10-CM | POA: Diagnosis present

## 2015-06-12 DIAGNOSIS — M21372 Foot drop, left foot: Secondary | ICD-10-CM | POA: Diagnosis present

## 2015-06-12 DIAGNOSIS — I482 Chronic atrial fibrillation: Secondary | ICD-10-CM | POA: Diagnosis present

## 2015-06-12 DIAGNOSIS — Z7952 Long term (current) use of systemic steroids: Secondary | ICD-10-CM | POA: Diagnosis not present

## 2015-06-12 DIAGNOSIS — J441 Chronic obstructive pulmonary disease with (acute) exacerbation: Secondary | ICD-10-CM | POA: Diagnosis present

## 2015-06-12 DIAGNOSIS — J9601 Acute respiratory failure with hypoxia: Secondary | ICD-10-CM | POA: Diagnosis present

## 2015-06-12 DIAGNOSIS — I5023 Acute on chronic systolic (congestive) heart failure: Secondary | ICD-10-CM | POA: Diagnosis present

## 2015-06-12 DIAGNOSIS — I509 Heart failure, unspecified: Secondary | ICD-10-CM | POA: Diagnosis present

## 2015-06-12 DIAGNOSIS — B9689 Other specified bacterial agents as the cause of diseases classified elsewhere: Secondary | ICD-10-CM | POA: Diagnosis present

## 2015-06-12 DIAGNOSIS — Z7951 Long term (current) use of inhaled steroids: Secondary | ICD-10-CM | POA: Diagnosis not present

## 2015-06-12 DIAGNOSIS — Z880 Allergy status to penicillin: Secondary | ICD-10-CM | POA: Diagnosis not present

## 2015-06-12 LAB — CBC
HEMATOCRIT: 37.2 % — AB (ref 40.0–52.0)
HEMOGLOBIN: 11.9 g/dL — AB (ref 13.0–18.0)
MCH: 29 pg (ref 26.0–34.0)
MCHC: 32.1 g/dL (ref 32.0–36.0)
MCV: 90.4 fL (ref 80.0–100.0)
Platelets: 351 10*3/uL (ref 150–440)
RBC: 4.12 MIL/uL — ABNORMAL LOW (ref 4.40–5.90)
RDW: 16.7 % — AB (ref 11.5–14.5)
WBC: 12.8 10*3/uL — ABNORMAL HIGH (ref 3.8–10.6)

## 2015-06-12 LAB — CREATININE, SERUM
Creatinine, Ser: 1.55 mg/dL — ABNORMAL HIGH (ref 0.61–1.24)
GFR calc Af Amer: 52 mL/min — ABNORMAL LOW
GFR calc non Af Amer: 45 mL/min — ABNORMAL LOW

## 2015-06-12 LAB — GLUCOSE, CAPILLARY: Glucose-Capillary: 105 mg/dL — ABNORMAL HIGH (ref 65–99)

## 2015-06-12 LAB — MRSA PCR SCREENING: MRSA by PCR: NEGATIVE

## 2015-06-12 LAB — LACTIC ACID, PLASMA
LACTIC ACID, VENOUS: 1.2 mmol/L (ref 0.5–2.0)
Lactic Acid, Venous: 1 mmol/L (ref 0.5–2.0)

## 2015-06-12 LAB — URIC ACID: Uric Acid, Serum: 10.8 mg/dL — ABNORMAL HIGH (ref 4.4–7.6)

## 2015-06-12 MED ORDER — DEXTROSE 5 % IV SOLN
5.0000 mg/h | INTRAVENOUS | Status: DC
  Administered 2015-06-12 – 2015-06-13 (×2): 5 mg/h via INTRAVENOUS
  Filled 2015-06-12 (×3): qty 100

## 2015-06-12 MED ORDER — GABAPENTIN 300 MG PO CAPS
300.0000 mg | ORAL_CAPSULE | Freq: Two times a day (BID) | ORAL | Status: DC
  Administered 2015-06-12 – 2015-06-16 (×10): 300 mg via ORAL
  Filled 2015-06-12 (×10): qty 1

## 2015-06-12 MED ORDER — SODIUM CHLORIDE 0.9% FLUSH
3.0000 mL | INTRAVENOUS | Status: DC | PRN
  Administered 2015-06-15 (×2): 3 mL via INTRAVENOUS
  Filled 2015-06-12 (×2): qty 3

## 2015-06-12 MED ORDER — ONDANSETRON HCL 4 MG/2ML IJ SOLN
4.0000 mg | Freq: Four times a day (QID) | INTRAMUSCULAR | Status: DC | PRN
  Administered 2015-06-12: 4 mg via INTRAVENOUS
  Filled 2015-06-12: qty 2

## 2015-06-12 MED ORDER — LEVOFLOXACIN IN D5W 750 MG/150ML IV SOLN: 750.0000 mg | INTRAVENOUS | Status: DC

## 2015-06-12 MED ORDER — IPRATROPIUM-ALBUTEROL 0.5-2.5 (3) MG/3ML IN SOLN: 3.0000 mL | Freq: Four times a day (QID) | RESPIRATORY_TRACT | Status: DC | PRN

## 2015-06-12 MED ORDER — SODIUM CHLORIDE 0.9 % IV SOLN: 250.0000 mL | INTRAVENOUS | Status: DC | PRN

## 2015-06-12 MED ORDER — MELATONIN 3 MG PO TABS: 3.0000 mg | ORAL_TABLET | Freq: Every evening | ORAL | Status: DC | PRN

## 2015-06-12 MED ORDER — IPRATROPIUM-ALBUTEROL 20-100 MCG/ACT IN AERS: 1.0000 | INHALATION_SPRAY | Freq: Four times a day (QID) | RESPIRATORY_TRACT | Status: DC | PRN

## 2015-06-12 MED ORDER — PRAVASTATIN SODIUM 20 MG PO TABS
20.0000 mg | ORAL_TABLET | Freq: Every day | ORAL | Status: DC
  Administered 2015-06-12 – 2015-06-15 (×5): 20 mg via ORAL
  Filled 2015-06-12 (×5): qty 1

## 2015-06-12 MED ORDER — HYDROCODONE-ACETAMINOPHEN 5-325 MG PO TABS
1.0000 | ORAL_TABLET | ORAL | Status: DC | PRN
  Administered 2015-06-12 – 2015-06-14 (×5): 2 via ORAL
  Administered 2015-06-15: 1 via ORAL
  Filled 2015-06-12 (×4): qty 2
  Filled 2015-06-12: qty 1
  Filled 2015-06-12: qty 2

## 2015-06-12 MED ORDER — LEVOFLOXACIN 750 MG PO TABS
750.0000 mg | ORAL_TABLET | Freq: Every day | ORAL | Status: DC
  Administered 2015-06-13 – 2015-06-16 (×4): 750 mg via ORAL
  Filled 2015-06-12 (×4): qty 1

## 2015-06-12 MED ORDER — SPIRONOLACTONE 25 MG PO TABS
12.5000 mg | ORAL_TABLET | Freq: Two times a day (BID) | ORAL | Status: DC
  Administered 2015-06-12 – 2015-06-16 (×5): 12.5 mg via ORAL
  Filled 2015-06-12 (×6): qty 1

## 2015-06-12 MED ORDER — SODIUM CHLORIDE 0.9% FLUSH
3.0000 mL | Freq: Two times a day (BID) | INTRAVENOUS | Status: DC
  Administered 2015-06-12 (×2): 3 mL via INTRAVENOUS

## 2015-06-12 MED ORDER — FUROSEMIDE 10 MG/ML IJ SOLN
40.0000 mg | Freq: Two times a day (BID) | INTRAMUSCULAR | Status: DC
  Administered 2015-06-12 – 2015-06-14 (×4): 40 mg via INTRAVENOUS
  Filled 2015-06-12 (×4): qty 4

## 2015-06-12 MED ORDER — VANCOMYCIN HCL IN DEXTROSE 1-5 GM/200ML-% IV SOLN
1000.0000 mg | Freq: Once | INTRAVENOUS | Status: AC
  Administered 2015-06-12: 1000 mg via INTRAVENOUS
  Filled 2015-06-12: qty 200

## 2015-06-12 MED ORDER — CARVEDILOL 6.25 MG PO TABS
6.2500 mg | ORAL_TABLET | Freq: Two times a day (BID) | ORAL | Status: DC
  Administered 2015-06-12: 6.25 mg via ORAL
  Filled 2015-06-12: qty 1

## 2015-06-12 MED ORDER — COLCHICINE 0.6 MG PO TABS
0.6000 mg | ORAL_TABLET | Freq: Every day | ORAL | Status: DC
  Administered 2015-06-12: 0.6 mg via ORAL
  Filled 2015-06-12: qty 1

## 2015-06-12 MED ORDER — VANCOMYCIN HCL IN DEXTROSE 1-5 GM/200ML-% IV SOLN
1000.0000 mg | INTRAVENOUS | Status: DC
  Administered 2015-06-12: 1000 mg via INTRAVENOUS
  Filled 2015-06-12 (×2): qty 200

## 2015-06-12 MED ORDER — LISINOPRIL 20 MG PO TABS
20.0000 mg | ORAL_TABLET | Freq: Every day | ORAL | Status: DC
Start: 2015-06-12 — End: 2015-06-12

## 2015-06-12 MED ORDER — ACETAMINOPHEN 325 MG PO TABS: 650.0000 mg | ORAL_TABLET | ORAL | Status: DC | PRN

## 2015-06-12 MED ORDER — VANCOMYCIN HCL IN DEXTROSE 750-5 MG/150ML-% IV SOLN
750.0000 mg | Freq: Two times a day (BID) | INTRAVENOUS | Status: DC
  Filled 2015-06-12: qty 150

## 2015-06-12 MED ORDER — MORPHINE SULFATE (PF) 2 MG/ML IV SOLN
2.0000 mg | Freq: Once | INTRAVENOUS | Status: AC
  Administered 2015-06-12: 2 mg via INTRAVENOUS
  Filled 2015-06-12: qty 1

## 2015-06-12 MED ORDER — LISINOPRIL 20 MG PO TABS
20.0000 mg | ORAL_TABLET | Freq: Every day | ORAL | Status: DC
  Administered 2015-06-14: 20 mg via ORAL
  Filled 2015-06-12 (×3): qty 1

## 2015-06-12 MED ORDER — TIOTROPIUM BROMIDE MONOHYDRATE 18 MCG IN CAPS
18.0000 ug | ORAL_CAPSULE | Freq: Every day | RESPIRATORY_TRACT | Status: DC
  Administered 2015-06-13 – 2015-06-15 (×3): 18 ug via RESPIRATORY_TRACT
  Filled 2015-06-12 (×2): qty 5

## 2015-06-12 MED ORDER — LEVOFLOXACIN IN D5W 750 MG/150ML IV SOLN
750.0000 mg | Freq: Once | INTRAVENOUS | Status: AC
  Administered 2015-06-12: 750 mg via INTRAVENOUS
  Filled 2015-06-12: qty 150

## 2015-06-12 MED ORDER — HEPARIN SODIUM (PORCINE) 5000 UNIT/ML IJ SOLN
5000.0000 [IU] | Freq: Three times a day (TID) | INTRAMUSCULAR | Status: DC
  Administered 2015-06-12 – 2015-06-13 (×5): 5000 [IU] via SUBCUTANEOUS
  Filled 2015-06-12 (×5): qty 1

## 2015-06-12 NOTE — ED Provider Notes (Signed)
-----------------------------------------   5:32 AM on 06/12/2015 -----------------------------------------   Blood pressure 104/57, pulse 126, temperature 97.7 F (36.5 C), temperature source Oral, resp. rate 20, height  (1.803 m), weight 221 lb (100.245 kg), SpO2 94 %.  Assuming care from Dr. Darnelle Catalan.  In short, George Hopkins is a 65 y.o. male with a chief complaint of Leg Swelling .  Refer to the original H&P for additional details.  The current plan of care is to transfer patient to [facility] further intensive care unit.  [facility] reports that he do not have any ICU beds. There was moved out of the intensive care unit here so we will omit this patient to the intensive care unit here.Rebecka Apley, MD 06/12/15 [phone]

## 2015-06-12 NOTE — Clinical Social Work Note (Signed)
CSW consult placed by RN CM for possible skilled placement. Patient has not yet been able to be evaluated by PT at this time. CSW attempted to speak with patient but he was receiving an ECHO at time of arrival. CSW will return to attempt full assessment. York Spaniel MSW,LCSW [phone]

## 2015-06-12 NOTE — Progress Notes (Signed)
Pharmacy Antibiotic Note  George Hopkins is a 65 y.o. male admitted on 06/11/2015 with sepsis.  Pharmacy has been consulted for vancomycin and Levaquin dosing.  Plan: TBW 100.2kg  IBW 75.3kg  DW 85kg  Vd 60L kei 0.041 hr-1  T1/2 17 hours Vancomycin 1 gram q 18 hours ordered with stacked dosing. Level before 5th dose. Goal 15-20.  Levaquin 750 mg IV q 48 hours ordered.  2/21: Scr improved since dosing. Will change to Levofloxacin 750 mg IV q24 hours.   Will change Vancomycin to 750 mg iv Q 12 hours. Trough level prior to the 4th dose.  Height:  (180.3 cm) Weight: 216 lb 11.4 oz (98.3 kg) IBW/kg (Calculated) : 75.3  Temp (24hrs), Avg:98.1 F (36.7 C), Min:97.7 F (36.5 C), Max:98.4 F (36.9 C)   Recent Labs Lab 06/11/15 1820 06/11/15 2121 06/12/15 0109 06/12/15 0726  WBC 15.0* 20.3*  --  12.8*  CREATININE 1.94*  --   --  1.55*  LATICACIDVEN  --  1.8 1.0 1.2    Estimated Creatinine Clearance: 55.3 mL/min (by C-G formula based on Cr of 1.55).    Allergies  Allergen Reactions  . Penicillins Hives, Swelling and Other (See Comments)    Has patient had a PCN reaction causing immediate rash, facial/tongue/throat swelling, SOB or lightheadedness with hypotension: Yes Has patient had a PCN reaction causing severe rash involving mucus membranes or skin necrosis: No Has patient had a PCN reaction that required hospitalization No Has patient had a PCN reaction occurring within the last 10 years: No If all of the above answers are "NO", then may proceed with Cephalosporin use.    Antimicrobials this admission:   Dose adjustments this admission:   Microbiology results: 06/12/15 BCx: pending 06/11/15 UCx: pending   Sputum:    MRSA PCR:    CXR: pulmonary edema UA: LE(+) NO2(-) WBC 6-30  Thank you for allowing pharmacy to be a part of this patient's care.  Todd Argabright D 06/12/2015 8:36 AM

## 2015-06-12 NOTE — Progress Notes (Addendum)
Pharmacy Antibiotic Note  George Hopkins is a 65 y.o. male admitted on 06/11/2015 with sepsis.  Pharmacy has been consulted for vancomycin and Levaquin dosing.  Plan: TBW 100.2kg  IBW 75.3kg  DW 85kg  Vd 60L kei 0.041 hr-1  T1/2 17 hours Vancomycin 1 gram q 18 hours ordered with stacked dosing. Level before 5th dose. Goal 15-20.  Levaquin 750 mg IV q 48 hours ordered.  Height:  (180.3 cm) Weight: 221 lb (100.245 kg) IBW/kg (Calculated) : 75.3  Temp (24hrs), Avg:97.7 F (36.5 C), Min:97.7 F (36.5 C), Max:97.7 F (36.5 C)   Recent Labs Lab 06/11/15 1820 06/11/15 2121 06/12/15 0109  WBC 15.0* 20.3*  --   CREATININE 1.94*  --   --   LATICACIDVEN  --  1.8 1.0    Estimated Creatinine Clearance: 44.6 mL/min (by C-G formula based on Cr of 1.94).    Allergies  Allergen Reactions  . Penicillins Hives, Swelling and Other (See Comments)    Has patient had a PCN reaction causing immediate rash, facial/tongue/throat swelling, SOB or lightheadedness with hypotension: Yes Has patient had a PCN reaction causing severe rash involving mucus membranes or skin necrosis: No Has patient had a PCN reaction that required hospitalization No Has patient had a PCN reaction occurring within the last 10 years: No If all of the above answers are "NO", then may proceed with Cephalosporin use.    Antimicrobials this admission:   Dose adjustments this admission:   Microbiology results: 06/12/15 BCx: pending 06/11/15 UCx: pending   Sputum:    MRSA PCR:    CXR: pulmonary edema UA: LE(+) NO2(-) WBC 6-30  Thank you for allowing pharmacy to be a part of this patient's care.  Daniele Yankowski S 06/12/2015 3:17 AM

## 2015-06-12 NOTE — Progress Notes (Signed)
Pt tx 233, pt VSS, pt verbalized understanding of tx, report called to receiving RN, all questions answered,

## 2015-06-12 NOTE — Progress Notes (Signed)
*  PRELIMINARY RESULTS* Echocardiogram 2D Echocardiogram has been performed.  George Hopkins 06/12/2015, 2:06 PM

## 2015-06-12 NOTE — H&P (Addendum)
[facility] - [place] at [facility]   PATIENT NAME: George Hopkins    MR#:  [id number]  DATE OF BIRTH:  05-09-1950  DATE OF ADMISSION:  06/11/2015  PRIMARY CARE PHYSICIAN: Derwood Kaplan, MD   REQUESTING/REFERRING PHYSICIAN:   CHIEF COMPLAINT:   Chief Complaint  Patient presents with  . Leg Swelling    HISTORY OF PRESENT ILLNESS: Arlyn Buerkle  is a 65 y.o. male with a known history of hypertension, congestive heart failure, COPD, gout, atrial fibrillation presented to the emergency room with increased-swelling in both legs. Patient was recently discharged a week ago from our hospital after being managed for heart failure. He has been retaining fluid and gradually increase in size of the legs. Patient also has difficulty breathing and orthopnea. No complaints of any chest pain. And was given IV digoxin for rate control in the emergency room. Was also given IV fluid bolus for low blood pressure. Received 1 dose of IV vancomycin antibiotic. Bedside lactic acid level is normal.No fever or chills.First set of troponin is negative.  PAST MEDICAL HISTORY:   Past Medical History  Diagnosis Date  . Hypertension   . CHF (congestive heart failure) (HCC)   . COPD (chronic obstructive pulmonary disease) (HCC)   . Diverticulitis   . Irregular heart beat   . Gout     PAST SURGICAL HISTORY: Past Surgical History  Procedure Laterality Date  . Cardiac surgery      SOCIAL HISTORY:  Social History  Substance Use Topics  . Smoking status: Former Games developer  . Smokeless tobacco: Never Used  . Alcohol Use: No    FAMILY HISTORY:  Family History  Problem Relation Age of Onset  . Dementia Mother     DRUG ALLERGIES:  Allergies  Allergen Reactions  . Penicillins Hives, Swelling and Other (See Comments)    Has patient had a PCN reaction causing immediate rash, facial/tongue/throat swelling, SOB or lightheadedness with hypotension: Yes Has patient had a PCN  reaction causing severe rash involving mucus membranes or skin necrosis: No Has patient had a PCN reaction that required hospitalization No Has patient had a PCN reaction occurring within the last 10 years: No If all of the above answers are "NO", then may proceed with Cephalosporin use.    REVIEW OF SYSTEMS:   CONSTITUTIONAL: No fever, has weakness.  EYES: No blurred or double vision.  EARS, NOSE, AND THROAT: No tinnitus or ear pain.  RESPIRATORY: No cough, has shortness of breath,no wheezing or hemoptysis.  CARDIOVASCULAR: No chest pain, has orthopnea, has edema.  GASTROINTESTINAL: No nausea, vomiting, diarrhea or abdominal pain.  GENITOURINARY: No dysuria, hematuria.  ENDOCRINE: No polyuria, nocturia,  HEMATOLOGY: No anemia, easy bruising or bleeding SKIN: No rash or lesion. MUSCULOSKELETAL: No joint pain or arthritis.  Swelling in both legs. NEUROLOGIC: No tingling, numbness, weakness.  PSYCHIATRY: No anxiety or depression.   MEDICATIONS AT HOME:  Prior to Admission medications   Medication Sig Start Date End Date Taking? Authorizing Provider  carvedilol (COREG) 6.25 MG tablet Take 6.25 mg by mouth 2 (two) times daily.   Yes Historical Provider, MD  colchicine (COLCRYS) 0.6 MG tablet Take 0.6 mg by mouth daily.   Yes Historical Provider, MD  furosemide (LASIX) 80 MG tablet Take 1 tablet (80 mg total) by mouth 2 (two) times daily. 06/03/15 06/02/16 Yes Sharman Cheek, MD  gabapentin (NEURONTIN) 300 MG capsule Take 1 capsule (300 mg total) by mouth 2 (two) times daily. 09/08/14  Yes Alford Highland, MD  Ipratropium-Albuterol (COMBIVENT) 20-100 MCG/ACT AERS respimat Inhale 1 puff into the lungs every 6 (six) hours as needed for wheezing or shortness of breath.    Yes Historical Provider, MD  lisinopril (PRINIVIL,ZESTRIL) 20 MG tablet Take 20 mg by mouth daily.   Yes Historical Provider, MD  Melatonin 3 MG TABS Take 3 mg by mouth at bedtime as needed (for sleep).   Yes Historical  Provider, MD  metoprolol tartrate (LOPRESSOR) 25 MG tablet Take 6.25 mg by mouth every 6 (six) hours.    Yes Historical Provider, MD  oxyCODONE-acetaminophen (PERCOCET/ROXICET) 5-325 MG tablet Take 1 tablet by mouth every 6 (six) hours as needed for severe pain.   Yes Historical Provider, MD  pravastatin (PRAVACHOL) 20 MG tablet Take 20 mg by mouth at bedtime.    Yes Historical Provider, MD  spironolactone (ALDACTONE) 25 MG tablet Take 12.5 mg by mouth 2 (two) times daily.   Yes Historical Provider, MD  tiotropium (SPIRIVA) 18 MCG inhalation capsule Place 18 mcg into inhaler and inhale daily.   Yes Historical Provider, MD  torsemide (DEMADEX) 20 MG tablet Take 20 mg by mouth daily.   Yes Historical Provider, MD  HYDROcodone-acetaminophen (NORCO) 5-325 MG per tablet Take 1-2 tablets by mouth every 4 (four) hours as needed for moderate pain. Patient not taking: Reported on 04/24/2015 01/02/15   Charmayne Sheer Beers, PA-C  naproxen (NAPROSYN) 500 MG tablet Take 1 tablet (500 mg total) by mouth 2 (two) times daily with a meal. Patient not taking: Reported on 04/24/2015 01/02/15   Charmayne Sheer Beers, PA-C  predniSONE (DELTASONE) 20 MG tablet Take 1 tablet (20 mg total) by mouth daily. Patient not taking: Reported on 06/11/2015 05/27/15 05/31/16  Jennye Moccasin, MD      PHYSICAL EXAMINATION:   VITAL SIGNS: Blood pressure 120/81, pulse 43, temperature 97.7 F (36.5 C), temperature source Oral, resp. rate 23, height  (1.803 m), weight 100.245 kg (221 lb), SpO2 95 %.  GENERAL:  65 y.o.-year-old patient lying in the bed with no acute distress.  EYES: Pupils equal, round, reactive to light and accommodation. No scleral icterus. Extraocular muscles intact.  HEENT: Head atraumatic, normocephalic. Oropharynx and nasopharynx clear.  NECK:  Supple, no jugular venous distention. No thyroid enlargement, no tenderness.  LUNGS:Decreased breath sounds bilaterally, basal crepitations in both lungs. No use of accessory  muscles of respiration.  CARDIOVASCULAR: S1, S2 normal. No murmurs, rubs, or gallops.  ABDOMEN: Soft, nontender, nondistended. Bowel sounds present. No organomegaly or mass.  EXTREMITIES: 3+ pedal edema, no cyanosis, or clubbing.  NEUROLOGIC: Cranial nerves II through XII are intact. Muscle strength 5/5 in all extremities. Sensation intact. Gait normal. PSYCHIATRIC: The patient is alert and oriented x 3.  SKIN: No obvious rash, lesion, or ulcer.   LABORATORY PANEL:   CBC  Recent Labs Lab 06/11/15 1820 06/11/15 2121  WBC 15.0* 20.3*  HGB 12.2* 12.2*  HCT 39.2* 38.7*  PLT 411 357  MCV 90.5 89.8  MCH 28.2 28.3  MCHC 31.2* 31.5*  RDW 16.9* 16.7*  LYMPHSABS  --  1.8  MONOABS  --  2.2*  EOSABS  --  0.1  BASOSABS  --  0.1   ------------------------------------------------------------------------------------------------------------------  Chemistries   Recent Labs Lab 06/11/15 1820 06/11/15 2121  NA 137  --   K 3.8  --   CL 102  --   CO2 26  --   GLUCOSE 98  --   BUN 32*  --  CREATININE 1.94*  --   CALCIUM 8.3*  --   AST  --  25  ALT  --  29  ALKPHOS  --  51  BILITOT  --  1.3*   ------------------------------------------------------------------------------------------------------------------ estimated creatinine clearance is 44.6 mL/min (by C-G formula based on Cr of 1.94). ------------------------------------------------------------------------------------------------------------------ No results for input(s): TSH, T4TOTAL, T3FREE, THYROIDAB in the last 72 hours.  Invalid input(s): FREET3   Coagulation profile No results for input(s): INR, PROTIME in the last 168 hours. ------------------------------------------------------------------------------------------------------------------- No results for input(s): DDIMER in the last 72 hours. -------------------------------------------------------------------------------------------------------------------  Cardiac  Enzymes  Recent Labs Lab 06/11/15 1820 06/11/15 2121  TROPONINI 0.03 0.06*   ------------------------------------------------------------------------------------------------------------------ Invalid input(s): POCBNP  ---------------------------------------------------------------------------------------------------------------  Urinalysis    Component Value Date/Time   COLORURINE AMBER* 06/11/2015 2121   COLORURINE Straw 05/17/2014 0603   APPEARANCEUR CLOUDY* 06/11/2015 2121   APPEARANCEUR Clear 05/17/2014 0603   LABSPEC 1.014 06/11/2015 2121   LABSPEC 1.006 05/17/2014 0603   PHURINE 9.0* 06/11/2015 2121   PHURINE 7.0 05/17/2014 0603   GLUCOSEU NEGATIVE 06/11/2015 2121   GLUCOSEU Negative 05/17/2014 0603   HGBUR 1+* 06/11/2015 2121   HGBUR Negative 05/17/2014 0603   BILIRUBINUR NEGATIVE 06/11/2015 2121   BILIRUBINUR Negative 05/17/2014 0603   KETONESUR NEGATIVE 06/11/2015 2121   KETONESUR Negative 05/17/2014 0603   PROTEINUR 100* 06/11/2015 2121   PROTEINUR Negative 05/17/2014 0603   NITRITE NEGATIVE 06/11/2015 2121   NITRITE Negative 05/17/2014 0603   LEUKOCYTESUR 3+* 06/11/2015 2121   LEUKOCYTESUR 2+ 05/17/2014 0603     RADIOLOGY: Dg Chest 2 View  06/11/2015  CLINICAL DATA:  65 year old with acute onset of shortness of breath, bilateral lower extremity edema and generalized weakness. Current history of hypertension, COPD and CHF. EXAM: CHEST  2 VIEW COMPARISON:  06/03/2015 and earlier. FINDINGS: AP erect and lateral images were obtained. Cardiac silhouette markedly enlarged, unchanged. Thoracic aorta mildly tortuous and atherosclerotic, unchanged. Hilar and mediastinal contours otherwise unremarkable. Mild diffuse interstitial pulmonary edema, increased since the most recent examination 8 days ago. No pleural effusions. No confluent airspace consolidation. Visualized bony thorax intact. IMPRESSION: Mild CHF, with stable marked cardiomegaly and mild diffuse interstitial  pulmonary edema which has increased since the examination 8 days ago. Electronically Signed   By: Hulan Saas M.D.   On: 06/11/2015 18:56    EKG: Orders placed or performed during the hospital encounter of 06/11/15  . ED EKG within 10 minutes  . ED EKG within 10 minutes    IMPRESSION AND PLAN: 65 year old male patient with history of congestive heart failure, chronic atrial fibrillation, hypertension, COPD, gout presented to the emergency room with swelling in both the legs and difficulty breathing. Patient was found to be in CHF exacerbation. Admitting diagnosis 1. Compensated heart failure 2. Respiratory distress 3. Fluid overload 4. Atrial fibrillation with rapid rate 5. Emphysema 6. Gout Treatment plan Admit patient to telemetry IV Lasix for diuresis blood pressure permits Resume Coreg for rate control If the rate is not controlled with Coreg,will start patient on IV Cardizem drip Cardiology consultation Oxygen via nasal cannula Low-salt diet Cycle troponin check for ischemia DVT prophylaxis with subcutaneous heparin.  All the records are reviewed and case discussed with ED provider. Management plans discussed with the patient, family and they are in agreement.  CODE STATUS:FULL Code Status History    Date Active Date Inactive Code Status Order ID Comments User Context   08/31/2014  7:36 PM 09/08/2014  1:54 PM Full Code [id number]  Adrian Saran, MD  Inpatient       TOTAL CRITICAL CARE TIME TAKING CARE OF THIS PATIENT: .    Ihor Austin M.D on 06/12/2015 at 12:37 AM  Between 7am to 6pm - Pager - [phone]  After 6pm go to [email or web address] - password [email or web address] [facility]  [address] [facility]  Office  [phone]  CC: Primary care physician; Derwood Kaplan, MD  Patient received two bolus of iv fluids for low blood pressure in ER. Will start patient on IV vancomycin and IV levaquin abx and follow up cultures.Bedside lactic acid normal. Patient hear  rate fluctuates around 130/min, will start patient on IV diltizem drip. Will admit patient to ICU.

## 2015-06-12 NOTE — Care Management (Addendum)
Patient presents from home with increased shortness of breath.  it is documented that patient had recent discharge from "the hospital".  Do not recent hospitalization at [facility] or on care Everywhere.  Patient has had 4 ED visits since January 2017.  Patient receives medicaid personal care services 7 days a week.  He has been followed by [facility] in the past.  He has chronic home 02 through [employer].  This patient is known to this CM and he is usually very verbal about what he expects from his care providers but today he is very lethargic.  He says he is too weak to manage at home alone.  Denies that he has any home health services in the home except  the personal care.  he is current on cardizem drip.  Requesting order for physical therapy from attending when medically stable.  Clarified with patient- He has not been inpatient recently.  When asked, he was referring to ED visits.

## 2015-06-12 NOTE — Progress Notes (Signed)
Pt received from ED and in no apparent distress, but is complaining of pain from his legs (knees and ankles). Pt transferred to ICU bed and hooked up to our monitor. Pt ht, wt, temp, CBG, MRSA, and CHG bath completed. Vitals WNL. Report will be given to [place], Charity fundraiser.

## 2015-06-12 NOTE — Progress Notes (Signed)
[facility] [place] at [facility]   PATIENT NAME: George Hopkins    MR#:  [id number]  DATE OF BIRTH:  1951-01-17  SUBJECTIVE:  CHIEF COMPLAINT:   Chief Complaint  Patient presents with  . Leg Swelling   the patient is 65 year old male with past medical history is negative in visit for essential hypertension, congestive heart failure, COPD, gout, atrial fibrillation presents to the the hospital with complaints of shortness of breath and lower extremity swelling, orthopnea. Initial chest x-ray revealed mild CHF, cardiomegaly, CT scan of abdomen and pelvis due to hematuria revealed no acute abdominal process, groundglass opacity within lung bases. Patient was diuresed and feels a little bit better today in regards to breathing, however, admits of significant lower extremity swelling. Complains of knee and feet pain, going on for a while   Review of Systems  Constitutional: Negative for fever, chills and weight loss.  HENT: Negative for congestion.   Eyes: Negative for blurred vision and double vision.  Respiratory: Positive for shortness of breath. Negative for cough, sputum production and wheezing.   Cardiovascular: Positive for leg swelling. Negative for chest pain, palpitations, orthopnea and PND.  Gastrointestinal: Negative for nausea, vomiting, abdominal pain, diarrhea, constipation and blood in stool.  Genitourinary: Negative for dysuria, urgency, frequency and hematuria.  Musculoskeletal: Positive for joint pain. Negative for falls.  Neurological: Negative for dizziness, tremors, focal weakness and headaches.  Endo/Heme/Allergies: Does not bruise/bleed easily.  Psychiatric/Behavioral: Negative for depression. The patient does not have insomnia.     VITAL SIGNS: Blood pressure 104/61, pulse 89, temperature 98.4 F (36.9 C), temperature source Oral, resp. rate 16, height  (1.803 m), weight 98.3 kg (216 lb 11.4 oz), SpO2 95 %.  PHYSICAL  EXAMINATION:   GENERAL:  65 y.o.-year-old patient lying in the bed with no acute distress.  EYES: Pupils equal, round, reactive to light and accommodation. No scleral icterus. Extraocular muscles intact.  HEENT: Head atraumatic, normocephalic. Oropharynx and nasopharynx clear.  NECK:  Supple, no jugular venous distention. No thyroid enlargement, no tenderness.  LUNGS: Normal breath sounds bilaterally, no wheezing, few rales,rhonchi and crepitations. No use of accessory muscles of respiration.  CARDIOVASCULAR: S1, S2 normal. No murmurs, rubs, or gallops.  ABDOMEN: Soft, nontender, nondistended. Bowel sounds present. No organomegaly or mass.  EXTREMITIES: 2+ lower extremity and pedal edema, no cyanosis, or clubbing.  NEUROLOGIC: Cranial nerves II through XII are intact. Muscle strength 5/5 in all extremities. Sensation intact. Gait not checked.  PSYCHIATRIC: The patient is alert and oriented x 3.  SKIN: No obvious rash, lesion, or ulcer.   ORDERS/RESULTS REVIEWED:   CBC  Recent Labs Lab 06/11/15 1820 06/11/15 2121 06/12/15 0726  WBC 15.0* 20.3* 12.8*  HGB 12.2* 12.2* 11.9*  HCT 39.2* 38.7* 37.2*  PLT 411 357 351  MCV 90.5 89.8 90.4  MCH 28.2 28.3 29.0  MCHC 31.2* 31.5* 32.1  RDW 16.9* 16.7* 16.7*  LYMPHSABS  --  1.8  --   MONOABS  --  2.2*  --   EOSABS  --  0.1  --   BASOSABS  --  0.1  --    ------------------------------------------------------------------------------------------------------------------  Chemistries   Recent Labs Lab 06/11/15 1820 06/11/15 2121 06/12/15 0726  NA 137  --   --   K 3.8  --   --   CL 102  --   --   CO2 26  --   --   GLUCOSE 98  --   --  BUN 32*  --   --   CREATININE 1.94*  --  1.55*  CALCIUM 8.3*  --   --   AST  --  25  --   ALT  --  29  --   ALKPHOS  --  51  --   BILITOT  --  1.3*  --    ------------------------------------------------------------------------------------------------------------------ estimated creatinine  clearance is 55.3 mL/min (by C-G formula based on Cr of 1.55). ------------------------------------------------------------------------------------------------------------------ No results for input(s): TSH, T4TOTAL, T3FREE, THYROIDAB in the last 72 hours.  Invalid input(s): FREET3  Cardiac Enzymes  Recent Labs Lab 06/11/15 1820 06/11/15 2121  TROPONINI 0.03 0.06*   ------------------------------------------------------------------------------------------------------------------ Invalid input(s): POCBNP ---------------------------------------------------------------------------------------------------------------  RADIOLOGY: Dg Chest 2 View  06/11/2015  CLINICAL DATA:  65 year old with acute onset of shortness of breath, bilateral lower extremity edema and generalized weakness. Current history of hypertension, COPD and CHF. EXAM: CHEST  2 VIEW COMPARISON:  06/03/2015 and earlier. FINDINGS: AP erect and lateral images were obtained. Cardiac silhouette markedly enlarged, unchanged. Thoracic aorta mildly tortuous and atherosclerotic, unchanged. Hilar and mediastinal contours otherwise unremarkable. Mild diffuse interstitial pulmonary edema, increased since the most recent examination 8 days ago. No pleural effusions. No confluent airspace consolidation. Visualized bony thorax intact. IMPRESSION: Mild CHF, with stable marked cardiomegaly and mild diffuse interstitial pulmonary edema which has increased since the examination 8 days ago. Electronically Signed   By: Hulan Saas M.D.   On: 06/11/2015 18:56   Ct Renal Stone Study  06/12/2015  CLINICAL DATA:  Initial valuation for one-day history of pain down left leg with hematuria. EXAM: CT ABDOMEN AND PELVIS WITHOUT CONTRAST TECHNIQUE: Multidetector CT imaging of the abdomen and pelvis was performed following the standard protocol without IV contrast. COMPARISON:  Prior study from 06/12/2005. FINDINGS: Mosaic ground-glass opacity within the  visualized lung bases, which may related to edema and/ or air trapping. Visualized lungs are otherwise clear. Cardiomegaly partially visualized. No pleural or pericardial effusion. Subcentimeter hypodensity within the left hepatic lobe noted, too small the characterize, but may reflect a small cyst. Limited noncontrast evaluation liver is otherwise unremarkable. Gallbladder within normal limits. No biliary dilatation. Spleen, adrenal glands, and pancreas demonstrate a normal unenhanced appearance. Right kidney unremarkable without evidence of nephrolithiasis or hydronephrosis. No radiopaque calculi seen along the course of the right renal collecting system. There is no right-sided hydroureter. Left kidney is small and atrophic in appearance as compared to the right. Vascular calcifications at the renal hilum. No nephrolithiasis or hydronephrosis. No radiopaque calculi seen along the course of the left renal collecting system. There is no left-sided hydroureter. Stomach within normal limits. No evidence for bowel obstruction. No abnormal wall thickening or inflammatory changes about the bowels. Appendix within normal limits. Bladder decompressed with a Foley catheter in place. Prostate normal. No free air identified. No adenopathy. 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 No aneurysm. No acute osseous abnormality. No worrisome lytic or blastic osseous lesions. IMPRESSION: 1. No CT evidence for nephrolithiasis or obstructive uropathy. Chronic atrophy and scarring of the left kidney. 2. Decompressed bladder with a Foley catheter in place. 3. No other acute intra-abdominal or pelvic process. 4. 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 No aneurysm. 5. Cardiomegaly with mosaic ground-glass opacity within the visualized lung bases, which may reflect pulmonary edema and/or air trapping. Chronic interstitial lung changes could also have this appearance. Electronically Signed   By:  Rise Mu M.D.   On: 06/12/2015 00:55    EKG:  Orders placed  or performed during the hospital encounter of 06/11/15  . ED EKG within 10 minutes  . ED EKG within 10 minutes    ASSESSMENT AND PLAN:  Active Problems:   A-fib (HCC)  #1 acute on chronic systolic CHF, more right-sided than left-sided, continue diuresis, follow ins and outs, weight, echocardiogram is pending #2. Acute respiratory failure with hypoxia, due to CHF as well as likely COPD, continue outpatient medications, diuretics, levofloxacin, changed to oral, blood cultures are negative so far\ #3. Urinary tract infection due to gram-negative rods, identification and susceptibility to follow, continue levofloxacin #4, joint pain, get uric acid level to rule out gout exacerbation, discontinue colchicine due to renal insufficiency, initiate patient on prednisone. If uric acid level is high #5. A. fib, RVR, now on Cardizem drip, digoxin, get digoxin level tomorrow morning #6. Leukocytosis, improved with therapy, follow in the morning   Management plans discussed with the patient, family and they are in agreement.   DRUG ALLERGIES:  Allergies  Allergen Reactions  . Penicillins Hives, Swelling and Other (See Comments)    Has patient had a PCN reaction causing immediate rash, facial/tongue/throat swelling, SOB or lightheadedness with hypotension: Yes Has patient had a PCN reaction causing severe rash involving mucus membranes or skin necrosis: No Has patient had a PCN reaction that required hospitalization No Has patient had a PCN reaction occurring within the last 10 years: No If all of the above answers are "NO", then may proceed with Cephalosporin use.    CODE STATUS:     Code Status Orders        Start     Ordered   06/12/15 0120  Full code   Continuous     06/12/15 0119    Code Status History    Date Active Date Inactive Code Status Order ID Comments User Context   08/31/2014  7:36 PM 09/08/2014  1:54  PM Full Code [id number]  Adrian Saran, MD Inpatient      TOTAL TIME TAKING CARE OF THIS PATIENT: 40 minutes.    Katharina Caper M.D on 06/12/2015 at 3:19 PM  Between 7am to 6pm - Pager - [phone]  After 6pm go to [email or web address] - password [email or web address] [facility]  [place] [facility]  Office  [phone]  CC: Primary care physician; Derwood Kaplan, MD

## 2015-06-13 LAB — CBC
HEMATOCRIT: 38.8 % — AB (ref 40.0–52.0)
HEMOGLOBIN: 12.4 g/dL — AB (ref 13.0–18.0)
MCH: 28.6 pg (ref 26.0–34.0)
MCHC: 31.9 g/dL — ABNORMAL LOW (ref 32.0–36.0)
MCV: 89.8 fL (ref 80.0–100.0)
Platelets: 361 10*3/uL (ref 150–440)
RBC: 4.32 MIL/uL — AB (ref 4.40–5.90)
RDW: 16.7 % — ABNORMAL HIGH (ref 11.5–14.5)
WBC: 7.8 10*3/uL (ref 3.8–10.6)

## 2015-06-13 LAB — BASIC METABOLIC PANEL
ANION GAP: 7 (ref 5–15)
BUN: 23 mg/dL — ABNORMAL HIGH (ref 6–20)
CALCIUM: 8.4 mg/dL — AB (ref 8.9–10.3)
CHLORIDE: 102 mmol/L (ref 101–111)
CO2: 30 mmol/L (ref 22–32)
Creatinine, Ser: 1.38 mg/dL — ABNORMAL HIGH (ref 0.61–1.24)
GFR calc non Af Amer: 51 mL/min — ABNORMAL LOW (ref >60)
GFR, EST AFRICAN AMERICAN: 60 mL/min — AB (ref >60)
GLUCOSE: 115 mg/dL — AB (ref 65–99)
Potassium: 4.3 mmol/L (ref 3.5–5.1)
Sodium: 139 mmol/L (ref 135–145)

## 2015-06-13 LAB — DIGOXIN LEVEL: Digoxin Level: 0.6 ng/mL — ABNORMAL LOW (ref 0.8–2.0)

## 2015-06-13 MED ORDER — METHYLPREDNISOLONE SODIUM SUCC 125 MG IJ SOLR
60.0000 mg | Freq: Once | INTRAMUSCULAR | Status: AC
  Administered 2015-06-13: 60 mg via INTRAVENOUS
  Filled 2015-06-13: qty 2

## 2015-06-13 MED ORDER — DILTIAZEM HCL ER COATED BEADS 120 MG PO CP24
120.0000 mg | ORAL_CAPSULE | Freq: Every day | ORAL | Status: DC
  Administered 2015-06-13 – 2015-06-14 (×2): 120 mg via ORAL
  Filled 2015-06-13 (×3): qty 1

## 2015-06-13 MED ORDER — PREDNISONE 50 MG PO TABS
50.0000 mg | ORAL_TABLET | Freq: Every day | ORAL | Status: DC
  Administered 2015-06-14 – 2015-06-16 (×3): 50 mg via ORAL
  Filled 2015-06-13 (×3): qty 1

## 2015-06-13 MED ORDER — ENOXAPARIN SODIUM 40 MG/0.4ML ~~LOC~~ SOLN
40.0000 mg | SUBCUTANEOUS | Status: DC
  Administered 2015-06-13 – 2015-06-15 (×3): 40 mg via SUBCUTANEOUS
  Filled 2015-06-13 (×3): qty 0.4

## 2015-06-13 NOTE — Clinical Documentation Improvement (Signed)
Internal Medicine Please update your documentation within the medical record to reflect your response to this query. Thank you  Can the diagnosis of CKD be further specified?   CKD Stage I - GFR greater than or equal to 90  CKD Stage II - GFR 60-89  CKD Stage III - GFR 30-59  CKD Stage IV - GFR 15-29  CKD Stage V - GFR < 15  ESRD (End Stage Renal Disease)  Other condition  Unable to clinically determine  Supporting Information:  06/12/15 Consult note.Marland KitchenMarland Kitchen"Renal function is reduce with ckd and serum creatinine of 1.94 with gfr of 34.".Marland Kitchen."Would continue with diuresis following renal function with torsemide and spironolactone following renal function and electrolytes.".Marland KitchenMarland Kitchen  Please exercise your independent, professional judgment when responding. A specific answer is not anticipated or expected.  Thank You, Toribio Harbour, RN, BSN, CCDS Certified Clinical Documentation Specialist [place]: Health Information Management [phone]

## 2015-06-13 NOTE — Progress Notes (Signed)
A fib. 3 L of oxgyen. Pt complained of pain and received meds. A &O. Worked with Pt and tolerated it well. Foley. Pt has no further concerns at this time.

## 2015-06-13 NOTE — Progress Notes (Signed)
MD. Dr. Anne Hahn notified about 9 beats of vtach- no new orders, will continue to monitor George Hopkins

## 2015-06-13 NOTE — Consult Note (Signed)
[facility] CARDIOLOGY [facility]  CARDIOLOGY CONSULT NOTE  Patient ID: George Hopkins MRN: [id number] DOB/AGE: 1950/09/13 65 y.o.  Admit date: 06/11/2015 Referring Physician Dr. Winona Legato Primary Physician   Primary Cardiologist   Reason for Consultation afib  HPI: Pt is a 65 yo male with history of afib, hypertension and copd. She also is treated for dementia. She was admitted after presenting to the er with complaints of increasaed swelling in both legs. He had been recently discharged from [facility] with chf. He complainted of sob and difficulty breathing with orthopnea and pnd. He denied chest pain. He was noted to have rvr on ekg. Initial tropoin was normal. He subsequently ruled out for mi. Echo revealed reduced lv funciton with ef of 25% with four chamber dilitation. Blood cultures negative. Has been on aldactone and torsemide. Renal function is reduce with ckd and serum creatinine of 1.94 with gfr of 34. He has improved with diuresis. CXR showed mild pulmonary edema.   Review of Systems  Constitutional: Positive for malaise/fatigue.  HENT: Negative.   Eyes: Negative.   Respiratory: Positive for shortness of breath.   Cardiovascular: Positive for orthopnea and leg swelling.  Gastrointestinal: Negative.   Genitourinary: Negative.   Musculoskeletal: Positive for myalgias.  Skin: Positive for rash.  Neurological: Positive for weakness.  Endo/Heme/Allergies: Negative.   Psychiatric/Behavioral: Negative.     Past Medical History  Diagnosis Date  . Hypertension   . CHF (congestive heart failure) (HCC)   . COPD (chronic obstructive pulmonary disease) (HCC)   . Diverticulitis   . Irregular heart beat   . Gout     Family History  Problem Relation Age of Onset  . Dementia Mother     Social History   Social History  . Marital Status: Single    Spouse Name: N/A  . Number of Children: N/A  . Years of Education: N/A   Occupational History  . disabled     Social History Main Topics  . Smoking status: Former Games developer  . Smokeless tobacco: Never Used  . Alcohol Use: No  . Drug Use: No  . Sexual Activity: Not on file   Other Topics Concern  . Not on file   Social History Narrative    Past Surgical History  Procedure Laterality Date  . Cardiac surgery       Prescriptions prior to admission  Medication Sig Dispense Refill Last Dose  . carvedilol (COREG) 6.25 MG tablet Take 6.25 mg by mouth 2 (two) times daily.   06/11/2015 at 0900  . colchicine (COLCRYS) 0.6 MG tablet Take 0.6 mg by mouth daily.   06/11/2015 at Unknown time  . furosemide (LASIX) 80 MG tablet Take 1 tablet (80 mg total) by mouth 2 (two) times daily. 60 tablet 0 06/11/2015 at Unknown time  . gabapentin (NEURONTIN) 300 MG capsule Take 1 capsule (300 mg total) by mouth 2 (two) times daily. 60 capsule 0 06/11/2015 at Unknown time  . Ipratropium-Albuterol (COMBIVENT) 20-100 MCG/ACT AERS respimat Inhale 1 puff into the lungs every 6 (six) hours as needed for wheezing or shortness of breath.    06/11/2015 at Unknown time  . lisinopril (PRINIVIL,ZESTRIL) 20 MG tablet Take 20 mg by mouth daily.   06/11/2015 at Unknown time  . Melatonin 3 MG TABS Take 3 mg by mouth at bedtime as needed (for sleep).   06/10/2015 at Unknown time  . metoprolol tartrate (LOPRESSOR) 25 MG tablet Take 6.25 mg by mouth every 6 (six) hours.  06/11/2015 at 0900  . oxyCODONE-acetaminophen (PERCOCET/ROXICET) 5-325 MG tablet Take 1 tablet by mouth every 6 (six) hours as needed for severe pain.   06/11/2015 at 0900  . pravastatin (PRAVACHOL) 20 MG tablet Take 20 mg by mouth at bedtime.    06/10/2015 at Unknown time  . spironolactone (ALDACTONE) 25 MG tablet Take 12.5 mg by mouth 2 (two) times daily.   06/11/2015 at Unknown time  . tiotropium (SPIRIVA) 18 MCG inhalation capsule Place 18 mcg into inhaler and inhale daily.   06/11/2015 at Unknown time  . torsemide (DEMADEX) 20 MG tablet Take 20 mg by mouth daily.   06/11/2015  at Unknown time  . HYDROcodone-acetaminophen (NORCO) 5-325 MG per tablet Take 1-2 tablets by mouth every 4 (four) hours as needed for moderate pain. (Patient not taking: Reported on 04/24/2015) 15 tablet 0   . naproxen (NAPROSYN) 500 MG tablet Take 1 tablet (500 mg total) by mouth 2 (two) times daily with a meal. (Patient not taking: Reported on 04/24/2015) 60 tablet 0   . predniSONE (DELTASONE) 20 MG tablet Take 1 tablet (20 mg total) by mouth daily. (Patient not taking: Reported on 06/11/2015) 5 tablet 0     Physical Exam: Blood pressure 91/66, pulse 68, temperature 97.5 F (36.4 C), temperature source Oral, resp. rate 18, height  (1.803 m), weight 113.082 kg (249 lb 4.8 oz), SpO2 98 %.   Wt Readings from Last 1 Encounters:  06/13/15 113.082 kg (249 lb 4.8 oz)     General appearance: alert and cooperative Head: Normocephalic, without obvious abnormality, atraumatic Resp: rhonchi bibasilar Cardio: irregularly irregular rhythm GI: soft, non-tender; bowel sounds normal; no masses,  no organomegaly Extremities: edema 4+ lower extremety edema Skin: ecchymoses - lower leg(s) bilateral and erythema - lower leg(s) bilateral Neurologic: Grossly normal  Labs:   Lab Results  Component Value Date   WBC 12.8* 06/12/2015   HGB 11.9* 06/12/2015   HCT 37.2* 06/12/2015   MCV 90.4 06/12/2015   PLT 351 06/12/2015    Recent Labs Lab 06/11/15 1820 06/11/15 2121 06/12/15 0726  NA 137  --   --   K 3.8  --   --   CL 102  --   --   CO2 26  --   --   BUN 32*  --   --   CREATININE 1.94*  --  1.55*  CALCIUM 8.3*  --   --   PROT  --  7.0  --   BILITOT  --  1.3*  --   ALKPHOS  --  51  --   ALT  --  29  --   AST  --  25  --   GLUCOSE 98  --   --    Lab Results  Component Value Date   CKTOTAL 103 07/31/2014   CKMB 6.0* 08/01/2014   TROPONINI 0.06* 06/11/2015      Radiology: Mild pulmonary edema and cardiomegaly EKG: afib with variable  vr  ASSESSMENT AND PLAN:  65 yo male with  dilated cardiomyopathy with ef of 25% readmitted with progressive peripheral edema and sob with afib with rvr. States he is compliant with meds as outpatient and has been on torsemide and spironolactone. Has ruled out for mi with no significant troponin elevation . Level of 0.06 is secondary to chronic chf and ckd. Renal funciton is decreased. Would continue with diuresis following renal function with torsemide and spironolactone following renal function and electrolytes. Agree with chf clinic referral.  Low sodium diet.  Signed: Dalia Heading MD, [facility] 06/13/2015, 7:32 AM

## 2015-06-13 NOTE — Progress Notes (Signed)
[facility] - [place] [facility]   PATIENT NAME: George Hopkins    MR#:  [id number]  DATE OF BIRTH:  Aug 11, 1950  SUBJECTIVE:  CHIEF COMPLAINT:   Chief Complaint  Patient presents with  . Leg Swelling   the patient is 65 year old male with past medical history is negative in visit for essential hypertension, congestive heart failure, COPD, gout, atrial fibrillation presents to the the hospital with complaints of shortness of breath and lower extremity swelling, orthopnea. Initial chest x-ray revealed mild CHF, cardiomegaly, CT scan of abdomen and pelvis due to hematuria revealed no acute abdominal process, groundglass opacity within lung bases. Patient was diuresed and feels a little bit better today in regards to breathing, however, admits of significant lower extremity swelling. Complains of knee and feet pain, going on for a while Feels satisfactory today, however, complains of significant left knee pain and swelling. Uric acid level was elevated above 10, steroids initiated. Patient has been diuresed approximately 1 L since admission. Patient feels otherwise comfortable, feels that lower extremity swelling is subsiding.    Review of Systems  Constitutional: Negative for fever, chills and weight loss.  HENT: Negative for congestion.   Eyes: Negative for blurred vision and double vision.  Respiratory: Positive for shortness of breath. Negative for cough, sputum production and wheezing.   Cardiovascular: Positive for leg swelling. Negative for chest pain, palpitations, orthopnea and PND.  Gastrointestinal: Negative for nausea, vomiting, abdominal pain, diarrhea, constipation and blood in stool.  Genitourinary: Negative for dysuria, urgency, frequency and hematuria.  Musculoskeletal: Positive for joint pain. Negative for falls.  Neurological: Negative for dizziness, tremors, focal weakness and headaches.  Endo/Heme/Allergies: Does not bruise/bleed easily.   Psychiatric/Behavioral: Negative for depression. The patient does not have insomnia.     VITAL SIGNS: Blood pressure 94/59, pulse 92, temperature 97.6 F (36.4 C), temperature source Oral, resp. rate 18, height  (1.803 m), weight 113.082 kg (249 lb 4.8 oz), SpO2 100 %.  PHYSICAL EXAMINATION:   GENERAL:  65 y.o.-year-old patient lying in the bed with no acute distress.  EYES: Pupils equal, round, reactive to light and accommodation. No scleral icterus. Extraocular muscles intact.  HEENT: Head atraumatic, normocephalic. Oropharynx and nasopharynx clear.  NECK:  Supple, no jugular venous distention. No thyroid enlargement, no tenderness.  LUNGS: Normal breath sounds bilaterally, no wheezing, few rales,rhonchi and crepitations. No use of accessory muscles of respiration.  CARDIOVASCULAR: S1, S2 normal. No murmurs, rubs, or gallops.  ABDOMEN: Soft, nontender, nondistended. Bowel sounds present. No organomegaly or mass.  EXTREMITIES: 2+ lower extremity and pedal edema, no cyanosis, or clubbing.  significantly decreased range of motion in bilateral knees, more on the right side. Painful palpation of the patient's knees and feet, swelling of joints  NEUROLOGIC: Cranial nerves II through XII are intact. Muscle strength 5/5 in all extremities. Sensation intact. Gait not checked.  PSYCHIATRIC: The patient is alert and oriented x 3.  SKIN: No obvious rash, lesion, or ulcer.   ORDERS/RESULTS REVIEWED:   CBC  Recent Labs Lab 06/11/15 1820 06/11/15 2121 06/12/15 0726 06/13/15 0814  WBC 15.0* 20.3* 12.8* 7.8  HGB 12.2* 12.2* 11.9* 12.4*  HCT 39.2* 38.7* 37.2* 38.8*  PLT 411 357 351 361  MCV 90.5 89.8 90.4 89.8  MCH 28.2 28.3 29.0 28.6  MCHC 31.2* 31.5* 32.1 31.9*  RDW 16.9* 16.7* 16.7* 16.7*  LYMPHSABS  --  1.8  --   --   MONOABS  --  2.2*  --   --  EOSABS  --  0.1  --   --   BASOSABS  --  0.1  --   --     ------------------------------------------------------------------------------------------------------------------  Chemistries   Recent Labs Lab 06/11/15 1820 06/11/15 2121 06/12/15 0726 06/13/15 0640  NA 137  --   --  139  K 3.8  --   --  4.3  CL 102  --   --  102  CO2 26  --   --  30  GLUCOSE 98  --   --  115*  BUN 32*  --   --  23*  CREATININE 1.94*  --  1.55* 1.38*  CALCIUM 8.3*  --   --  8.4*  AST  --  25  --   --   ALT  --  29  --   --   ALKPHOS  --  51  --   --   BILITOT  --  1.3*  --   --    ------------------------------------------------------------------------------------------------------------------ estimated creatinine clearance is 66.4 mL/min (by C-G formula based on Cr of 1.38). ------------------------------------------------------------------------------------------------------------------ No results for input(s): TSH, T4TOTAL, T3FREE, THYROIDAB in the last 72 hours.  Invalid input(s): FREET3  Cardiac Enzymes  Recent Labs Lab 06/11/15 1820 06/11/15 2121  TROPONINI 0.03 0.06*   ------------------------------------------------------------------------------------------------------------------ Invalid input(s): POCBNP ---------------------------------------------------------------------------------------------------------------  RADIOLOGY: Dg Chest 2 View  06/11/2015  CLINICAL DATA:  65 year old with acute onset of shortness of breath, bilateral lower extremity edema and generalized weakness. Current history of hypertension, COPD and CHF. EXAM: CHEST  2 VIEW COMPARISON:  06/03/2015 and earlier. FINDINGS: AP erect and lateral images were obtained. Cardiac silhouette markedly enlarged, unchanged. Thoracic aorta mildly tortuous and atherosclerotic, unchanged. Hilar and mediastinal contours otherwise unremarkable. Mild diffuse interstitial pulmonary edema, increased since the most recent examination 8 days ago. No pleural effusions. No confluent airspace  consolidation. Visualized bony thorax intact. IMPRESSION: Mild CHF, with stable marked cardiomegaly and mild diffuse interstitial pulmonary edema which has increased since the examination 8 days ago. Electronically Signed   By: Hulan Saas M.D.   On: 06/11/2015 18:56   Ct Renal Stone Study  06/12/2015  CLINICAL DATA:  Initial valuation for one-day history of pain down left leg with hematuria. EXAM: CT ABDOMEN AND PELVIS WITHOUT CONTRAST TECHNIQUE: Multidetector CT imaging of the abdomen and pelvis was performed following the standard protocol without IV contrast. COMPARISON:  Prior study from 06/12/2005. FINDINGS: Mosaic ground-glass opacity within the visualized lung bases, which may related to edema and/ or air trapping. Visualized lungs are otherwise clear. Cardiomegaly partially visualized. No pleural or pericardial effusion. Subcentimeter hypodensity within the left hepatic lobe noted, too small the characterize, but may reflect a small cyst. Limited noncontrast evaluation liver is otherwise unremarkable. Gallbladder within normal limits. No biliary dilatation. Spleen, adrenal glands, and pancreas demonstrate a normal unenhanced appearance. Right kidney unremarkable without evidence of nephrolithiasis or hydronephrosis. No radiopaque calculi seen along the course of the right renal collecting system. There is no right-sided hydroureter. Left kidney is small and atrophic in appearance as compared to the right. Vascular calcifications at the renal hilum. No nephrolithiasis or hydronephrosis. No radiopaque calculi seen along the course of the left renal collecting system. There is no left-sided hydroureter. Stomach within normal limits. No evidence for bowel obstruction. No abnormal wall thickening or inflammatory changes about the bowels. Appendix within normal limits. Bladder decompressed with a Foley catheter in place. Prostate normal. No free air identified. No adenopathy. Moderate to advanced aorto  bi-iliac atherosclerotic disease.  No aneurysm. No acute osseous abnormality. No worrisome lytic or blastic osseous lesions. IMPRESSION: 1. No CT evidence for nephrolithiasis or obstructive uropathy. Chronic atrophy and scarring of the left kidney. 2. Decompressed bladder with a Foley catheter in place. 3. No other acute intra-abdominal or pelvic process. 4. 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 No aneurysm. 5. Cardiomegaly with mosaic ground-glass opacity within the visualized lung bases, which may reflect pulmonary edema and/or air trapping. Chronic interstitial lung changes could also have this appearance. Electronically Signed   By: Rise Mu M.D.   On: 06/12/2015 00:55    EKG:  Orders placed or performed during the hospital encounter of 06/11/15  . ED EKG within 10 minutes  . ED EKG within 10 minutes    ASSESSMENT AND PLAN:  Active Problems:   A-fib (HCC)  #1 acute on chronic systolic CHF, more right-sided than left-sided, continue diuresis, follow ins and outs, weight, echocardiogram  revealed ejection fraction of 25-30%, mild mitral regurgitation  #2. Acute respiratory failure with hypoxia, due to CHF as well as COPD, continue outpatient medications, diuretics, levofloxacin oraly, blood cultures are negative so far #3. 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 A. fib, RVR, now  Of  Cardizem drip,  continue digoxin, digoxin levetoday was 0.6, initiate patient on Cardizem CD at 120 mg daily dose, advance as tolerated . Advancement may be difficult due to relative hypotension  #6. Leukocytosis, improved with therapy   Management plans discussed with the patient, family and they are in  agreement.   DRUG ALLERGIES:  Allergies  Allergen Reactions  . Penicillins Hives, Swelling and Other (See Comments)    Has patient had a PCN reaction causing immediate rash, facial/tongue/throat swelling, SOB or lightheadedness with hypotension: Yes Has patient had a PCN reaction causing severe rash involving mucus membranes or skin necrosis: No Has patient had a PCN reaction that required hospitalization No Has patient had a PCN reaction occurring within the last 10 years: No If all of the above answers are "NO", then may proceed with Cephalosporin use.    CODE STATUS:     Code Status Orders        Start     Ordered   06/12/15 0120  Full code   Continuous     06/12/15 0119    Code Status History    Date Active Date Inactive Code Status Order ID Comments User Context   08/31/2014  7:36 PM 09/08/2014  1:54 PM Full Code [id number]  Adrian Saran, MD Inpatient      TOTAL TIME TAKING CARE OF THIS PATIENT:  35 minutes .    Katharina Caper M.D on 06/13/2015 at 3:59 PM  Between 7am to 6pm - Pager - [phone]  After 6pm go to [email or web address] - password [email or web address] [facility]  [facility]  Office  [phone]  CC: Primary care physician; Derwood Kaplan, MD

## 2015-06-13 NOTE — Progress Notes (Signed)
Initial appointment was made at the [facility] for June 27, 2015 at 9:00am. Of note, patient hasn't shown for 3 previously scheduled appointments.

## 2015-06-13 NOTE — Discharge Instructions (Signed)
[facility] appointment on June 27, 2015 at 9:00am with Clarisa Kindred, FNP. Please call [phone] to reschedule.

## 2015-06-13 NOTE — Progress Notes (Signed)
MD Winona Legato was notified of BP 91/66 and HR 94. Order was to give 120 mg carzidem 120 mg daily and stop cardizem gtt 1 hour after. No further orders.

## 2015-06-13 NOTE — Evaluation (Signed)
Physical Therapy Evaluation Patient Details Name: George Hopkins MRN: [id number] DOB: 09/07/1950 Today's Date: 06/13/2015   History of Present Illness  65 yo M presented to ED for increasing SOB and weakness, with difficulty walking. He was found to have a-fib with RVR, elevated troponin, hypotension and renal stone. Pt has had multiple visits to the ED since January. PMH includes CHF, COPD, HTN, and irregulate heart rate.  Clinical Impression  Pt presents with multiple medical issues with deficits of strength, endurance and functional mobility. He has fair seated balance and is able to maintain independently. Requires +2 assistance for STS at EOB and demonstrated very poor posture and strength of LEs and wasn't able to stand fully upright. SpO2 maintained 100% on 3 L and HR ranged 92 to 133 bpm. Due to weakness and decreased ability to stand, stand pivot transfer or gait was not tested. Pt will benefit from skilled PT services to increase functional I and mobility for safe discharge. He is currently unsafe to return home due to highly decreased mobility, inaccessible home and decreased caregiver support.     Follow Up Recommendations SNF    Equipment Recommendations  Other (comment) (pt has recommended FWW)    Recommendations for Other Services       Precautions / Restrictions Precautions Precautions: Fall Restrictions Weight Bearing Restrictions: No      Mobility  Bed Mobility Overal bed mobility: Needs Assistance Bed Mobility: Supine to Sit;Sit to Supine     Supine to sit: Mod assist;HOB elevated Sit to supine: Mod assist;HOB elevated   General bed mobility comments: uses rail, assistance for LEs  Transfers Overall transfer level: Needs assistance Equipment used: Rolling walker (2 wheeled) Transfers: Sit to/from Stand Sit to Stand: Max assist;+2 physical assistance         General transfer comment: pt able to stand but tolerates for only about 30  seconds  Ambulation/Gait             General Gait Details: NT  Stairs            Wheelchair Mobility    Modified Rankin (Stroke Patients Only)       Balance Overall balance assessment: Needs assistance Sitting-balance support: Feet supported Sitting balance-Leahy Scale: Fair     Standing balance support: Bilateral upper extremity supported Standing balance-Leahy Scale: Poor Standing balance comment: painful LEs, +2 assistance                             Pertinent Vitals/Pain Pain Assessment: 0-10 Pain Score: 4  Pain Location: knees and feet Pain Descriptors / Indicators: Aching;Throbbing (Pt has history of gout.) Pain Intervention(s): Limited activity within patient's tolerance;Monitored during session    Home Living Family/patient expects to be discharged to:: Private residence Living Arrangements: Alone   Type of Home: Apartment Home Access: Stairs to enter Entrance Stairs-Rails: None Entrance Stairs-Number of Steps: 3 Home Layout: One level Home Equipment: Walker - 4 wheels;Walker - 2 wheels;Cane - single point      Prior Function Level of Independence: Independent with assistive device(s)         Comments: Varied between using FWW/rollator/SPC or no AD depending on weakness. Pt had an aid coming in each day to assist him for about 2 hours.     Hand Dominance        Extremity/Trunk Assessment   Upper Extremity Assessment: Generalized weakness           Lower  Extremity Assessment: Generalized weakness;LLE deficits/detail (L LE weaker than R LE)   LLE Deficits / Details: decreased  foot DF, L LE grossly 3+/5 strength, R LE grossly 4/5 strength     Communication   Communication: No difficulties  Cognition Arousal/Alertness: Awake/alert Behavior During Therapy: WFL for tasks assessed/performed Overall Cognitive Status: Within Functional Limits for tasks assessed                      General Comments General  comments (skin integrity, edema, etc.): B LE edema    Exercises Other Exercises Other Exercises: B LE seated therex: ankle pumps, LAQ, marching, hip add squeezes x 10 each. VC for technique.      Assessment/Plan    PT Assessment Patient needs continued PT services  PT Diagnosis Difficulty walking;Generalized weakness   PT Problem List Decreased strength;Decreased activity tolerance;Decreased balance;Decreased mobility;Pain  PT Treatment Interventions Gait training;Stair training;Therapeutic activities;Therapeutic exercise;Balance training;Patient/family education   PT Goals (Current goals can be found in the Care Plan section) Acute Rehab PT Goals Patient Stated Goal: to walk PT Goal Formulation: With patient Time For Goal Achievement: 06/27/15 Potential to Achieve Goals: Fair    Frequency Min 2X/week   Barriers to discharge Inaccessible home environment no support    Co-evaluation               End of Session Equipment Utilized During Treatment: Gait belt;Oxygen Activity Tolerance: Patient limited by pain;Patient limited by fatigue Patient left: in bed;with call bell/phone within reach;with bed alarm set Nurse Communication: Mobility status         Time: 1355-1429 PT Time Calculation (min) (ACUTE ONLY): 34 min   Charges:   PT Evaluation $PT Eval Moderate Complexity: 1 Procedure PT Treatments $Therapeutic Exercise: 8-22 mins   PT G Codes:        Adelene Idler, PT, DPT  06/13/2015, 4:50 PM [phone]

## 2015-06-14 ENCOUNTER — Inpatient Hospital Stay: Payer: Medicare Other

## 2015-06-14 DIAGNOSIS — L8990 Pressure ulcer of unspecified site, unspecified stage: Secondary | ICD-10-CM | POA: Insufficient documentation

## 2015-06-14 LAB — BASIC METABOLIC PANEL
ANION GAP: 6 (ref 5–15)
BUN: 28 mg/dL — ABNORMAL HIGH (ref 6–20)
CALCIUM: 8.1 mg/dL — AB (ref 8.9–10.3)
CO2: 28 mmol/L (ref 22–32)
Chloride: 102 mmol/L (ref 101–111)
Creatinine, Ser: 1.34 mg/dL — ABNORMAL HIGH (ref 0.61–1.24)
GFR, EST NON AFRICAN AMERICAN: 53 mL/min — AB (ref >60)
Glucose, Bld: 121 mg/dL — ABNORMAL HIGH (ref 65–99)
POTASSIUM: 4.4 mmol/L (ref 3.5–5.1)
Sodium: 136 mmol/L (ref 135–145)

## 2015-06-14 MED ORDER — ASPIRIN EC 81 MG PO TBEC
81.0000 mg | DELAYED_RELEASE_TABLET | Freq: Every day | ORAL | Status: DC
  Administered 2015-06-14 – 2015-06-16 (×3): 81 mg via ORAL
  Filled 2015-06-14 (×3): qty 1

## 2015-06-14 NOTE — Clinical Social Work Note (Signed)
Clinical Social Work Assessment  Patient Details  Name: George Hopkins MRN: [id number] Date of Birth: 1951-02-27  Date of referral:  06/14/15               Reason for consult:  Discharge Planning                Permission sought to share information with:  Family Supports Permission granted to share information::  Yes, Verbal Permission Granted  Name::        Agency::     Relationship::   (George Hopkins- Sister )  Sport and exercise psychologist Information:     Housing/Transportation Living arrangements for the past 2 months:  Single Family Home Source of Information:  Patient Patient Interpreter Needed:  None Criminal Activity/Legal Involvement Pertinent to Current Situation/Hospitalization:  No - Comment as needed Significant Relationships:  Siblings Lives with:  Self Do you feel safe going back to the place where you live?  Yes Need for family participation in patient care:  Yes (Comment) (George Hopkins- Sister )  Care giving concerns:  Patient requested SNF placement after PT evaluation.    Social Worker assessment / plan:  CSW was informed by PT that SNF would benefit patient at discharge. CSW met with patient at bedside. CSW explained her role. Per patient he lives home alone. He stated that he has a walker, cane and wheelchair but does not use those items to walk. Per patient her feels that he needs SNF placement to assist him with regaining his strength. Verbal permission granted to refer him to SNFs in [facility]. CSW inquired about family members she could contact to update them on patient's discharge plans. Patient granted CSW verbal permission to speak to his sister George Hopkins. SNF prefrence [facility].   FL2/ PASRR completed and faxed to SNFs in [facility]. Awaiting bed offers. CSW will continue to follow and assist.   Employment status:  Retired Forensic scientist:  Information systems manager, Medicaid In [place] PT Recommendations:  [place] /  Referral to community resources:  [place]  Patient/Family's Response to care:  Patient is in agreement with SNF placement at discharge.   Patient/Family's Understanding of and Emotional Response to Diagnosis, Current Treatment, and Prognosis:  Patient understands CSW's role and is appreciative of her assistance with finding a SNF placement.   Emotional Assessment Appearance:  Appears younger than stated age Attitude/Demeanor/Rapport:   (None) Affect (typically observed):  Accepting, Calm, Pleasant Orientation:  Oriented to Self, Oriented to Place, Oriented to  Time, Oriented to Situation Alcohol / Substance use:  Not Applicable Psych involvement (Current and /or in the community):  No (Comment)  Discharge Needs  Concerns to be addressed:  Discharge Planning Concerns Readmission within the last 30 days:  No Current discharge risk:  Chronically ill Barriers to Discharge:  Continued Medical Work up   [employer], LCSW 06/14/2015, 3:16 PM

## 2015-06-14 NOTE — NC FL2 (Signed)
[place] MEDICAID FL2 LEVEL OF CARE SCREENING TOOL     IDENTIFICATION  Patient Name: George Hopkins Birthdate: 12-27-1950 Sex: male Admission Date (Current Location): 06/11/2015  [facility] [place] Number:  [address]  ([phone]) Facility and Address:  [facility], [address] [place], [place] [address]      Provider Number: [id number]  Attending Physician Name and Address:  Katharina Caper, MD  Relative Name and Phone Number:       Current Level of Care: Hospital Recommended Level of Care: Skilled Nursing Facility Prior Approval Number:    Date Approved/Denied:   PASRR Number:  [id number])  Discharge Plan: SNF    Current Diagnoses: Patient Active Problem List   Diagnosis Date Noted  . Pressure ulcer 06/14/2015  . A-fib (HCC) 06/12/2015  . CHF (congestive heart failure) (HCC) 08/31/2014    Orientation RESPIRATION BLADDER Height & Weight     Self, Time, Situation, Place  O2 (Nasal Cannula 3 (L/min) ) Continent Weight: 250 lb 1.6 oz (113.445 kg) Height:   (180.3 cm)  BEHAVIORAL SYMPTOMS/MOOD NEUROLOGICAL BOWEL NUTRITION STATUS   (None)  (None) Continent Diet (2 gram sodium )  AMBULATORY STATUS COMMUNICATION OF NEEDS Skin   Extensive Assist Verbally Other (Comment) (Pressure Ulcer Stage II [place], medial buttocks & Other-right lower sacrum)                       Personal Care Assistance Level of Assistance  Bathing, Feeding, Dressing Bathing Assistance: Limited assistance Feeding assistance: Independent Dressing Assistance: Limited assistance     Functional Limitations Info  Sight, Hearing, Speech Sight Info: Adequate Hearing Info: Adequate Speech Info: Adequate    SPECIAL CARE FACTORS FREQUENCY  PT (By licensed PT)     PT Frequency:  (5)              Contractures      Additional Factors Info  Code Status, Allergies Code Status Info:  (Full Code) Allergies Info:  (Penicillins)            Current Medications (06/14/2015):  This is the current hospital active medication list Current Facility-Administered Medications  Medication Dose Route Frequency Provider Last Rate Last Dose  . 0.9 %  sodium chloride infusion  250 mL Intravenous PRN Ihor Austin, MD      . acetaminophen (TYLENOL) tablet 650 mg  650 mg Oral Q4H PRN Ihor Austin, MD      . diltiazem (CARDIZEM CD) 24 hr capsule 120 mg  120 mg Oral Daily Katharina Caper, MD   120 mg at 06/14/15 0904  . enoxaparin (LOVENOX) injection 40 mg  40 mg Subcutaneous Q24H Katharina Caper, MD   40 mg at 06/13/15 2225  . furosemide (LASIX) injection 40 mg  40 mg Intravenous Q12H Ihor Austin, MD   40 mg at 06/14/15 0545  . gabapentin (NEURONTIN) capsule 300 mg  300 mg Oral BID Ihor Austin, MD   300 mg at 06/14/15 0856  . HYDROcodone-acetaminophen (NORCO/VICODIN) 5-325 MG per tablet 1-2 tablet  1-2 tablet Oral Q4H PRN Ihor Austin, MD   2 tablet at 06/13/15 2225  . ipratropium-albuterol (DUONEB) 0.5-2.5 (3) MG/3ML nebulizer solution 3 mL  3 mL Nebulization Q6H PRN Katharina Caper, MD      . levofloxacin (LEVAQUIN) tablet 750 mg  750 mg Oral Daily Katharina Caper, MD   750 mg at 06/14/15 0857  . lisinopril (PRINIVIL,ZESTRIL) tablet 20 mg  20 mg Oral Daily Ihor Austin, MD  20 mg at 06/14/15 0904  . ondansetron (ZOFRAN) injection 4 mg  4 mg Intravenous Q6H PRN Ihor Austin, MD   4 mg at 06/12/15 4098  . pravastatin (PRAVACHOL) tablet 20 mg  20 mg Oral QHS Ihor Austin, MD   20 mg at 06/13/15 2225  . predniSONE (DELTASONE) tablet 50 mg  50 mg Oral Q breakfast Katharina Caper, MD   50 mg at 06/14/15 0857  . sodium chloride flush (NS) 0.9 % injection 3 mL  3 mL Intravenous PRN Pavan Pyreddy, MD      . spironolactone (ALDACTONE) tablet 12.5 mg  12.5 mg Oral BID Ihor Austin, MD   12.5 mg at 06/14/15 0857  . tiotropium (SPIRIVA) inhalation capsule 18 mcg  18 mcg Inhalation Daily Ihor Austin, MD   18 mcg at 06/14/15 1191     Discharge  Medications: Please see discharge summary for a list of discharge medications.  Relevant Imaging Results:  Relevant Lab Results:   Additional Information  (SSN [id number])  Verta Ellen Jaylen Claude, LCSW

## 2015-06-14 NOTE — Progress Notes (Signed)
[facility] - [place] at [facility]   PATIENT NAME: George Hopkins    MR#:  [id number]  DATE OF BIRTH:  1951/03/12  SUBJECTIVE:  CHIEF COMPLAINT:   Chief Complaint  Patient presents with  . Leg Swelling   the patient is 65 year old male with past medical history is negative in visit for essential hypertension, congestive heart failure, COPD, gout, atrial fibrillation presents to the the hospital with complaints of shortness of breath and lower extremity swelling, orthopnea. Initial chest x-ray revealed mild CHF, cardiomegaly, CT scan of abdomen and pelvis due to hematuria revealed no acute abdominal process, groundglass opacity within lung bases. Patient was diuresed and feels a little bit better today in regards to breathing, however, admits of significant lower extremity swelling. Complains of knee and feet pain, going on for a while Feels overall, however, has difficulty moving left foot, swelling in lower extremities is subsiding with diuretics, pain in the knees is also subsiding with steroids for gout exacerbation.    Review of Systems  Constitutional: Negative for fever, chills and weight loss.  HENT: Negative for congestion.   Eyes: Negative for blurred vision and double vision.  Respiratory: Positive for shortness of breath. Negative for cough, sputum production and wheezing.   Cardiovascular: Positive for leg swelling. Negative for chest pain, palpitations, orthopnea and PND.  Gastrointestinal: Negative for nausea, vomiting, abdominal pain, diarrhea, constipation and blood in stool.  Genitourinary: Negative for dysuria, urgency, frequency and hematuria.  Musculoskeletal: Positive for joint pain. Negative for falls.  Neurological: Negative for dizziness, tremors, focal weakness and headaches.  Endo/Heme/Allergies: Does not bruise/bleed easily.  Psychiatric/Behavioral: Negative for depression. The patient does not have insomnia.     VITAL SIGNS: Blood  pressure 91/57, pulse 86, temperature 98.3 F (36.8 C), temperature source Oral, resp. rate 18, height 5\' 11"  (1.803 m), weight 113.445 kg (250 lb 1.6 oz), SpO2 99 %.  PHYSICAL EXAMINATION:   GENERAL:  65 y.o.-year-old patient lying in the bed with no acute distress.  EYES: Pupils equal, round, reactive to light and accommodation. No scleral icterus. Extraocular muscles intact.  HEENT: Head atraumatic, normocephalic. Oropharynx and nasopharynx clear.  NECK:  Supple, no jugular venous distention. No thyroid enlargement, no tenderness.  LUNGS: Normal breath sounds bilaterally, no wheezing, few rales,rhonchi and crepitations. No use of accessory muscles of respiration.  CARDIOVASCULAR: S1, S2 normal. No murmurs, rubs, or gallops.  ABDOMEN: Soft, nontender, nondistended. Bowel sounds present. No organomegaly or mass.  EXTREMITIES: 2+ lower extremity and pedal edema, no cyanosis, or clubbing.  significantly decreased range of motion in bilateral knees, more on the right side. Painful palpation of the patient's knees and feet, swelling of joints  NEUROLOGIC: Cranial nerves II through XII are intact. Muscle strength 5/5 in all extremities. Sensation intact. Gait not checked. Unable to flex or extend left foot. Diminished sensation to light touch in the left lower extremity, All other extremities. Strength was 5 out of 5,  PSYCHIATRIC: The patient is alert and oriented x 3.  SKIN: No obvious rash, lesion, or ulcer.   ORDERS/RESULTS REVIEWED:   CBC  Recent Labs Lab 06/11/15 1820 06/11/15 2121 06/12/15 0726 06/13/15 0814  WBC 15.0* 20.3* 12.8* 7.8  HGB 12.2* 12.2* 11.9* 12.4*  HCT 39.2* 38.7* 37.2* 38.8*  PLT 411 357 351 361  MCV 90.5 89.8 90.4 89.8  MCH 28.2 28.3 29.0 28.6  MCHC 31.2* 31.5* 32.1 31.9*  RDW 16.9* 16.7* 16.7* 16.7*  LYMPHSABS  --  1.8  --   --  MONOABS  --  2.2*  --   --   EOSABS  --  0.1  --   --   BASOSABS  --  0.1  --   --     ------------------------------------------------------------------------------------------------------------------  Chemistries   Recent Labs Lab 06/11/15 1820 06/11/15 2121 06/12/15 0726 06/13/15 0640 06/14/15 0432  NA 137  --   --  139 136  K 3.8  --   --  4.3 4.4  CL 102  --   --  102 102  CO2 26  --   --  30 28  GLUCOSE 98  --   --  115* 121*  Hopkins 32*  --   --  23* 28*  CREATININE 1.94*  --  1.55* 1.38* 1.34*  CALCIUM 8.3*  --   --  8.4* 8.1*  AST  --  25  --   --   --   ALT  --  29  --   --   --   ALKPHOS  --  51  --   --   --   BILITOT  --  1.3*  --   --   --    ------------------------------------------------------------------------------------------------------------------ estimated creatinine clearance is 68.5 mL/min (by C-G formula based on Cr of 1.34). ------------------------------------------------------------------------------------------------------------------ No results for input(s): TSH, T4TOTAL, T3FREE, THYROIDAB in the last 72 hours.  Invalid input(s): FREET3  Cardiac Enzymes  Recent Labs Lab 06/11/15 1820 06/11/15 2121  TROPONINI 0.03 0.06*   ------------------------------------------------------------------------------------------------------------------ Invalid input(s): POCBNP ---------------------------------------------------------------------------------------------------------------  RADIOLOGY: No results found.  EKG:  Orders placed or performed during the hospital encounter of 06/11/15  . ED EKG within 10 minutes  . ED EKG within 10 minutes    ASSESSMENT AND PLAN:  Active Problems:   A-fib (HCC)   Pressure ulcer  #1 acute on chronic systolic CHF, more right-sided than left-sided, continue diuresis, . Patient diuresed approximately 1.7 L since his admission, echocardiogram  revealed ejection fraction of 25-30%, mild mitral regurgitation , wean to  room air  #2. Acute respiratory failure with hypoxia, due to CHF as well as COPD,  continue outpatient medications, diuretics, levofloxacin oraly, blood cultures are negative so far #3. 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 A. fib, RVR, now  Of  Cardizem drip,  continue digoxin, digoxin level recently was 0.6, continue Cardizem CD at 120 mg daily dose, advance as tolerated . Advancement may be difficult due to relative hypotension  #6. Leukocytosis, improved with therapy 7. Left  lower extremity weakness, getting MRI of the brain to rule out stroke,  Initiate aspirin therapy   Management plans discussed with the patient, family and they are in agreement.   DRUG ALLERGIES:  Allergies  Allergen Reactions  . Penicillins Hives, Swelling and Other (See Comments)    Has patient had a PCN reaction causing immediate rash, facial/tongue/throat swelling, SOB or lightheadedness with hypotension: Yes Has patient had a PCN reaction causing severe rash involving mucus membranes or skin necrosis: No Has patient had a PCN reaction that required hospitalization No Has patient had a PCN reaction occurring within the last 10 years: No If all of the above answers are "NO", then may proceed with Cephalosporin use.    CODE  STATUS:     Code Status Orders        Start     Ordered   06/12/15 0120  Full code   Continuous     06/12/15 0119    Code Status History    Date Active Date Inactive Code Status Order ID Comments User Context   08/31/2014  7:36 PM 09/08/2014  1:54 PM Full Code [id number]  Adrian Saran, MD Inpatient      TOTAL TIME TAKING CARE OF THIS PATIENT:  35 minutes .    Katharina Caper M.D on 06/14/2015 at 2:11 PM  Between 7am to 6pm -  Pager - [phone]  After 6pm go to [email or web address] - password [email or web address] [facility]  [place]  Hospitalists  Office  [phone]  CC: Primary care physician; Derwood Kaplan, MD

## 2015-06-14 NOTE — Care Management Important Message (Signed)
Important Message  Patient Details  Name: George Hopkins MRN: [id number] Date of Birth: [birth year]   Medicare Important Message Given:  Yes    [name] 06/14/2015, 12:01 PM

## 2015-06-14 NOTE — Progress Notes (Addendum)
CSW presented bed offers to patient. Patient chose a bed at [facility]. Patient requested EMS transport at discharge. CSW contacted Jomarie Longs- Admissions Coordinator at [facility] and informed him that patient accepted bed offer and would possibly discharge tomorrow if medically stable. CSW will continue to follow and assist.   Woodroe Mode, MSW, LCSW-A Clinical Social Work Department [phone]

## 2015-06-15 ENCOUNTER — Inpatient Hospital Stay: Payer: Medicare Other

## 2015-06-15 DIAGNOSIS — R29898 Other symptoms and signs involving the musculoskeletal system: Secondary | ICD-10-CM | POA: Insufficient documentation

## 2015-06-15 LAB — BASIC METABOLIC PANEL
Anion gap: 6 (ref 5–15)
BUN: 37 mg/dL — AB (ref 6–20)
CALCIUM: 8.2 mg/dL — AB (ref 8.9–10.3)
CHLORIDE: 102 mmol/L (ref 101–111)
CO2: 29 mmol/L (ref 22–32)
CREATININE: 1.45 mg/dL — AB (ref 0.61–1.24)
GFR calc non Af Amer: 48 mL/min — ABNORMAL LOW (ref >60)
GFR, EST AFRICAN AMERICAN: 56 mL/min — AB (ref >60)
Glucose, Bld: 129 mg/dL — ABNORMAL HIGH (ref 65–99)
Potassium: 4.3 mmol/L (ref 3.5–5.1)
SODIUM: 137 mmol/L (ref 135–145)

## 2015-06-15 MED ORDER — DILTIAZEM HCL 30 MG PO TABS
30.0000 mg | ORAL_TABLET | Freq: Four times a day (QID) | ORAL | Status: DC
  Administered 2015-06-16 (×2): 30 mg via ORAL
  Filled 2015-06-15 (×3): qty 1

## 2015-06-15 MED ORDER — DIGOXIN 125 MCG PO TABS: 0.1250 mg | ORAL_TABLET | ORAL | Status: DC

## 2015-06-15 NOTE — Care Management (Signed)
Received blood culture results today and they are positive.  Attending will be discussing result with lab and maybe ID because sensitivities are not available.

## 2015-06-15 NOTE — Progress Notes (Signed)
Physical Therapy Treatment Patient Details Name: George Hopkins MRN: [id number] DOB: Feb 17, 1951 Today's Date: 06/15/2015    History of Present Illness 65 yo M presented to ED for increasing SOB and weakness, with difficulty walking. He was found to have a-fib with RVR, elevated troponin, hypotension and renal stone. Pt has had multiple visits to the ED since January. PMH includes CHF, COPD, HTN, and irregulate heart rate.    PT Comments     Pt demonstrated significant progress towards functional goals. He performed bed mobility with mod I using rail and elevated HOB. Transfers completed with FWW and min A. Ambulated 33ftx2 and 85ftx1 with FWW and min A with VC for safety. Cadence was slow but steady with no LOB. SpO2 98% on 3L and HR 67. Therapeutic rest breaks were taken for energy conservation.   Follow Up Recommendations  SNF     Equipment Recommendations       Recommendations for Other Services       Precautions / Restrictions Precautions Precautions: Fall Restrictions Weight Bearing Restrictions: No    Mobility  Bed Mobility Overal bed mobility: Needs Assistance Bed Mobility: Supine to Sit     Supine to sit: Modified independent (Device/Increase time);HOB elevated     General bed mobility comments: uses rail  Transfers Overall transfer level: Needs assistance Equipment used: Rolling walker (2 wheeled) Transfers: Sit to/from [employer] Sit to Stand: Min assist;From elevated surface Stand pivot transfers: Min assist       General transfer comment: VC for hand placement and safety  Ambulation/Gait Ambulation/Gait assistance: Min assist Ambulation Distance (Feet): 40 Feet Assistive device: Rolling walker (2 wheeled) Gait Pattern/deviations: Step-to pattern;Trunk flexed;Narrow base of support     General Gait Details: Pt ambulated 74ftx2 and 29ftx1 with FWW and min A with chair follow. Demonstrated good steadiness, no LOB. VC for  safety.   Stairs            Wheelchair Mobility    Modified Rankin (Stroke Patients Only)       Balance Overall balance assessment: Needs assistance Sitting-balance support: No upper extremity supported Sitting balance-Leahy Scale: Good Sitting balance - Comments: maintains independently   Standing balance support: Bilateral upper extremity supported Standing balance-Leahy Scale: Fair Standing balance comment: maintains static balance with min A                    Cognition Arousal/Alertness: Awake/alert Behavior During Therapy: WFL for tasks assessed/performed Overall Cognitive Status: Within Functional Limits for tasks assessed                      Exercises Other Exercises Other Exercises: B LE seated therex: LAQ, marching, hip add squeezes x 10 each. VC for technique.    General Comments        Pertinent Vitals/Pain Pain Assessment: No/denies pain    Home Living                      Prior Function            PT Goals (current goals can now be found in the care plan section) Acute Rehab PT Goals PT Goal Formulation: With patient Time For Goal Achievement: 06/27/15 Potential to Achieve Goals: Fair Progress towards PT goals: Progressing toward goals    Frequency  Min 2X/week    PT Plan Current plan remains appropriate    Co-evaluation  End of Session Equipment Utilized During Treatment: Gait belt;Oxygen Activity Tolerance: Patient tolerated treatment well Patient left: in chair;with call bell/phone within reach;with chair alarm set     Time: 1034-1101 PT Time Calculation (min) (ACUTE ONLY): 27 min  Charges:  $Gait Training: 8-22 mins $Therapeutic Exercise: 8-22 mins                    G Codes:      Adelene Idler, PT, DPT  06/15/2015, 11:42 AM [phone]

## 2015-06-15 NOTE — Consult Note (Signed)
Reason for Consult:Left leg weakness Referring Physician: Winona Legato  CC: Left leg weakness   HPI: George Hopkins is an 65 y.o. male who reports that about 3 weeks ago he became unable to walk around well due to left leg weakness.  He reports that he may have had some symptoms earlier than that because when he was in therapy he noted that his left leg did not look like his right leg.  He has no appreciable numbness.  He reports no upper extremity symptoms.    Past Medical History  Diagnosis Date  . Hypertension   . CHF (congestive heart failure) (HCC)   . COPD (chronic obstructive pulmonary disease) (HCC)   . Diverticulitis   . Irregular heart beat   . Gout     Past Surgical History  Procedure Laterality Date  . Cardiac surgery      Family History  Problem Relation Age of Onset  . Dementia Mother     Social History:  reports that he has quit smoking. He has never used smokeless tobacco. He reports that he does not drink alcohol or use illicit drugs.  Allergies  Allergen Reactions  . Penicillins Hives, Swelling and Other (See Comments)    Has patient had a PCN reaction causing immediate rash, facial/tongue/throat swelling, SOB or lightheadedness with hypotension: Yes Has patient had a PCN reaction causing severe rash involving mucus membranes or skin necrosis: No Has patient had a PCN reaction that required hospitalization No Has patient had a PCN reaction occurring within the last 10 years: No If all of the above answers are "NO", then may proceed with Cephalosporin use.    Medications:  I have reviewed the patient's current medications. Prior to Admission:  Prescriptions prior to admission  Medication Sig Dispense Refill Last Dose  . carvedilol (COREG) 6.25 MG tablet Take 6.25 mg by mouth 2 (two) times daily.   06/11/2015 at 0900  . colchicine (COLCRYS) 0.6 MG tablet Take 0.6 mg by mouth daily.   06/11/2015 at Unknown time  . furosemide (LASIX) 80 MG tablet Take 1  tablet (80 mg total) by mouth 2 (two) times daily. 60 tablet 0 06/11/2015 at Unknown time  . gabapentin (NEURONTIN) 300 MG capsule Take 1 capsule (300 mg total) by mouth 2 (two) times daily. 60 capsule 0 06/11/2015 at Unknown time  . Ipratropium-Albuterol (COMBIVENT) 20-100 MCG/ACT AERS respimat Inhale 1 puff into the lungs every 6 (six) hours as needed for wheezing or shortness of breath.    06/11/2015 at Unknown time  . lisinopril (PRINIVIL,ZESTRIL) 20 MG tablet Take 20 mg by mouth daily.   06/11/2015 at Unknown time  . Melatonin 3 MG TABS Take 3 mg by mouth at bedtime as needed (for sleep).   06/10/2015 at Unknown time  . metoprolol tartrate (LOPRESSOR) 25 MG tablet Take 6.25 mg by mouth every 6 (six) hours.    06/11/2015 at 0900  . oxyCODONE-acetaminophen (PERCOCET/ROXICET) 5-325 MG tablet Take 1 tablet by mouth every 6 (six) hours as needed for severe pain.   06/11/2015 at 0900  . pravastatin (PRAVACHOL) 20 MG tablet Take 20 mg by mouth at bedtime.    06/10/2015 at Unknown time  . spironolactone (ALDACTONE) 25 MG tablet Take 12.5 mg by mouth 2 (two) times daily.   06/11/2015 at Unknown time  . tiotropium (SPIRIVA) 18 MCG inhalation capsule Place 18 mcg into inhaler and inhale daily.   06/11/2015 at Unknown time  . torsemide (DEMADEX) 20 MG tablet Take 20 mg  by mouth daily.   06/11/2015 at Unknown time  . HYDROcodone-acetaminophen (NORCO) 5-325 MG per tablet Take 1-2 tablets by mouth every 4 (four) hours as needed for moderate pain. (Patient not taking: Reported on 04/24/2015) 15 tablet 0   . naproxen (NAPROSYN) 500 MG tablet Take 1 tablet (500 mg total) by mouth 2 (two) times daily with a meal. (Patient not taking: Reported on 04/24/2015) 60 tablet 0   . predniSONE (DELTASONE) 20 MG tablet Take 1 tablet (20 mg total) by mouth daily. (Patient not taking: Reported on 06/11/2015) 5 tablet 0    Scheduled: . aspirin EC  81 mg Oral Daily  . digoxin  0.125 mg Oral Q48H  . diltiazem  30 mg Oral 4 times per day  .  enoxaparin (LOVENOX) injection  40 mg Subcutaneous Q24H  . gabapentin  300 mg Oral BID  . levofloxacin  750 mg Oral Daily  . pravastatin  20 mg Oral QHS  . predniSONE  50 mg Oral Q breakfast  . spironolactone  12.5 mg Oral BID  . tiotropium  18 mcg Inhalation Daily    ROS: History obtained from the patient  General ROS: negative for - chills, fatigue, fever, night sweats, weight gain or weight loss Psychological ROS: negative for - behavioral disorder, hallucinations, memory difficulties, mood swings or suicidal ideation Ophthalmic ROS: negative for - blurry vision, double vision, eye pain or loss of vision ENT ROS: negative for - epistaxis, nasal discharge, oral lesions, sore throat, tinnitus or vertigo Allergy and Immunology ROS: negative for - hives or itchy/watery eyes Hematological and Lymphatic ROS: negative for - bleeding problems, bruising or swollen lymph nodes Endocrine ROS: negative for - galactorrhea, hair pattern changes, polydipsia/polyuria or temperature intolerance Respiratory ROS: negative for - cough, hemoptysis, shortness of breath or wheezing Cardiovascular ROS: negative for - chest pain, dyspnea on exertion, edema or irregular heartbeat Gastrointestinal ROS: negative for - abdominal pain, diarrhea, hematemesis, nausea/vomiting or stool incontinence Genito-Urinary ROS: negative for - dysuria, hematuria, incontinence or urinary frequency/urgency Musculoskeletal ROS: negative for - joint swelling or muscular weakness Neurological ROS: as noted in HPI Dermatological ROS: negative for rash and skin lesion changes  Physical Examination: Blood pressure 98/54, pulse 67, temperature 97.5 F (36.4 C), temperature source Oral, resp. rate 18, height 5\' 11"  (1.803 m), weight 113.354 kg (249 lb 14.4 oz), SpO2 98 %.  HEENT-  Normocephalic, no lesions, without obvious abnormality.  Normal external eye and conjunctiva.  Normal TM's bilaterally.  Normal auditory canals and external  ears. Normal external nose, mucus membranes and septum.  Normal pharynx. Cardiovascular- S1, S2 normal, pulses palpable throughout   Lungs- chest clear, no wheezing, rales, normal symmetric air entry Abdomen- soft, non-tender; bowel sounds normal; no masses,  no organomegaly Extremities- mild lower extremity edema, left greater than right Lymph-no adenopathy palpable Musculoskeletal-no joint tenderness, deformity or swelling Skin-warm and dry, no hyperpigmentation, vitiligo, or suspicious lesions  Neurological Examination Mental Status: Alert, oriented, thought content appropriate.  Speech fluent without evidence of aphasia.  Able to follow 3 step commands without difficulty. Cranial Nerves: II: Discs flat bilaterally; Visual fields grossly normal, pupils equal, round, reactive to light and accommodation III,IV, VI: ptosis not present, extra-ocular motions intact bilaterally V,VII: smile symmetric, facial light touch sensation normal bilaterally VIII: hearing normal bilaterally IX,X: gag reflex present XI: bilateral shoulder shrug XII: midline tongue extension Motor: Right : Upper extremity   5/5    Left:     Upper extremity   5/5 5/5  in the RLE.  Left Hip flexor strength 4/5, quad 3/5, hamstrings 3+/5, 0/5 plantar flexion/extension/eversion, 1/5 inversion. Atrophy noted in the hand intrinsics and in the LLE throughout Sensory: Pinprick and light touch decreased on the sole of the LLE Deep Tendon Reflexes: 2+ and symmetric with absent AJ's bilaterally Plantars: Right: mute   Left: mute Cerebellar: Normal finger-to-nose and normal heel-to-shin testing on the right.  Unable to perform on the LLE due to weakness Gait: not tested due to safety concerns.     Laboratory Studies:   Basic Metabolic Panel:  Recent Labs Lab 06/11/15 1820 06/12/15 0726 06/13/15 0640 06/14/15 0432 06/15/15 0424  NA 137  --  139 136 137  K 3.8  --  4.3 4.4 4.3  CL 102  --  102 102 102  CO2 26  --  30  28 29   GLUCOSE 98  --  115* 121* 129*  BUN 32*  --  23* 28* 37*  CREATININE 1.94* 1.55* 1.38* 1.34* 1.45*  CALCIUM 8.3*  --  8.4* 8.1* 8.2*    Liver Function Tests:  Recent Labs Lab 06/11/15 2121  AST 25  ALT 29  ALKPHOS 51  BILITOT 1.3*  PROT 7.0  ALBUMIN 2.4*   No results for input(s): LIPASE, AMYLASE in the last 168 hours. No results for input(s): AMMONIA in the last 168 hours.  CBC:  Recent Labs Lab 06/11/15 1820 06/11/15 2121 06/12/15 0726 06/13/15 0814  WBC 15.0* 20.3* 12.8* 7.8  NEUTROABS  --  16.2*  --   --   HGB 12.2* 12.2* 11.9* 12.4*  HCT 39.2* 38.7* 37.2* 38.8*  MCV 90.5 89.8 90.4 89.8  PLT 411 357 351 361    Cardiac Enzymes:  Recent Labs Lab 06/11/15 1820 06/11/15 2121  TROPONINI 0.03 0.06*    BNP: Invalid input(s): POCBNP  CBG:  Recent Labs Lab 06/12/15 0640  GLUCAP 105*    Microbiology: Results for orders placed or performed during the hospital encounter of 06/11/15  Urine culture     Status: None (Preliminary result)   Collection Time: 06/11/15  9:21 PM  Result Value Ref Range Status   Specimen Description URINE, RANDOM  Final   Special Requests Normal  Final   Culture   Final    >=100,000 COLONIES/mL PROTEUS MIRABILIS >=100,000 COLONIES/mL GRAM NEGATIVE RODS IDENTIFICATION AND SUSCEPTIBILITIES TO FOLLOW FOR ORGANISM 2    Report Status PENDING  Incomplete   Organism ID, Bacteria PROTEUS MIRABILIS  Final      Susceptibility   Proteus mirabilis - MIC*    AMPICILLIN <=2 SENSITIVE Sensitive     CEFAZOLIN 8 SENSITIVE Sensitive     CEFTRIAXONE <=1 SENSITIVE Sensitive     CIPROFLOXACIN <=0.25 SENSITIVE Sensitive     GENTAMICIN <=1 SENSITIVE Sensitive     IMIPENEM 2 SENSITIVE Sensitive     NITROFURANTOIN 128 RESISTANT Resistant     TRIMETH/SULFA <=20 SENSITIVE Sensitive     AMPICILLIN/SULBACTAM <=2 SENSITIVE Sensitive     PIP/TAZO <=4 SENSITIVE Sensitive     * >=100,000 COLONIES/mL PROTEUS MIRABILIS  Blood culture (routine x  2)     Status: None (Preliminary result)   Collection Time: 06/12/15 12:57 AM  Result Value Ref Range Status   Specimen Description BLOOD BLOOD RIGHT FOREARM  Final   Special Requests BOTTLES DRAWN AEROBIC AND ANAEROBIC  Final   Culture NO GROWTH 3 DAYS  Final   Report Status PENDING  Incomplete  Blood culture (routine x 2)  Status: None (Preliminary result)   Collection Time: 06/12/15  1:08 AM  Result Value Ref Range Status   Specimen Description BLOOD RIGHT HAND  Final   Special Requests   Final    BOTTLES DRAWN AEROBIC AND ANAEROBIC ,   Culture NO GROWTH 3 DAYS  Final   Report Status PENDING  Incomplete  MRSA PCR Screening     Status: None   Collection Time: 06/12/15  8:17 AM  Result Value Ref Range Status   MRSA by PCR NEGATIVE NEGATIVE Final    Comment:        The GeneXpert MRSA Assay (FDA approved for NASAL specimens only), is one component of a comprehensive MRSA colonization surveillance program. It is not intended to diagnose MRSA infection nor to guide or monitor treatment for MRSA infections.     Coagulation Studies: No results for input(s): LABPROT, INR in the last 72 hours.  Urinalysis:  Recent Labs Lab 06/11/15 2121  COLORURINE AMBER*  LABSPEC 1.014  PHURINE 9.0*  GLUCOSEU NEGATIVE  HGBUR 1+*  BILIRUBINUR NEGATIVE  KETONESUR NEGATIVE  PROTEINUR 100*  NITRITE NEGATIVE  LEUKOCYTESUR 3+*    Lipid Panel:     Component Value Date/Time   CHOL 161 05/18/2014 0353   TRIG 112 05/18/2014 0353   HDL 40 05/18/2014 0353   VLDL 22 05/18/2014 0353   LDLCALC 99 05/18/2014 0353    HgbA1C: No results found for: HGBA1C  Urine Drug Screen:  No results found for: LABOPIA, COCAINSCRNUR, LABBENZ, AMPHETMU, THCU, LABBARB  Alcohol Level: No results for input(s): ETH in the last 168 hours.   Imaging: Mr Brain Wo Contrast  06/14/2015  CLINICAL DATA:  Left leg weakness. Lower extremity swelling. Unable to walk for 3 weeks. Right  arm pain and weakness. EXAM: MRI HEAD WITHOUT CONTRAST TECHNIQUE: Multiplanar, multiecho pulse sequences of the brain and surrounding structures were obtained without intravenous contrast. COMPARISON:  None. FINDINGS: There is no evidence of acute infarct, intracranial hemorrhage, mass, midline shift, or extra-axial fluid collection. There is mild cerebral atrophy which is slightly more prominent in the parietal regions. Small foci of T2 hyperintensity scattered throughout the cerebral white matter bilaterally and in the pons are nonspecific but compatible with mild chronic small vessel ischemic disease. Orbits are unremarkable. Paranasal sinuses and mastoid air cells are clear. Major intracranial vascular flow voids are preserved. IMPRESSION: 1. No acute intracranial abnormality. 2. Mild chronic small vessel ischemic disease and cerebral atrophy. Electronically Signed   By: Sebastian Ache M.D.   On: 06/14/2015 14:36   Dg Foot Complete Left  06/14/2015  CLINICAL DATA:  Left foot pain.  No reported injury. EXAM: LEFT FOOT - COMPLETE 3+ VIEW COMPARISON:  None. FINDINGS: No fracture, dislocation or suspicious focal osseous lesion. Lisfranc joint appears intact. There focal cortical erosions at the medial aspect of the talus on the oblique view, with surrounding soft tissue swelling. No pathologic soft tissue calcifications. 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 These results will be called to the ordering clinician or representative by the Radiologist Assistant, and communication documented in the PACS or zVision Dashboard. Electronically Signed   By: Delbert Phenix M.D.   On: 06/14/2015 18:20     Assessment/Plan: 65 year old male with gradually progressive weakness of the LLE that encompasses all muscle groups.  No significant sensory findings.  This is beyond what would  be expected from a peroneal palsy.  MRI of the brain personally reviewed and shows no acute changes or abnormalities to explain weakness.  Lumbar spine should be investigated.  Unfortunately from the patient's description he has been unable to move the left foot for at least 3 weeks which makes the prognosis poor for him getting the function back but possibly further damage may be able to averted.    Recommendations: 1.  MRI of the lumbar spine.  If unremarkable patient to have a NCV/EMG as an outpatient.   2.  Physical therapy   Thana Farr, MD Neurology [phone] 06/15/2015, 2:15 PM

## 2015-06-15 NOTE — Progress Notes (Signed)
[facility] [place] at [facility]   PATIENT NAME: George Hopkins    MR#:  [id number]  DATE OF BIRTH:  08/31/1950  SUBJECTIVE:  CHIEF COMPLAINT:   Chief Complaint  Patient presents with  . Leg Swelling   the patient is 65 year old male with past medical history is negative in visit for essential hypertension, congestive heart failure, COPD, gout, atrial fibrillation presents to the the hospital with complaints of shortness of breath and lower extremity swelling, orthopnea. Initial chest x-ray revealed mild CHF, cardiomegaly, CT scan of abdomen and pelvis due to hematuria revealed no acute abdominal process, groundglass opacity within lung bases. Patient was diuresed and feels a little bit better today in regards to breathing, however, admits of significant lower extremity swelling. Complains of knee and feet pain, going on for a while Feels good , still has  difficulty moving left foot, swelling in lower extremities is subsiding with diuretics, pain in the knees is also subsiding with steroids for gout exacerbation.  Patient was seen by podiatrist and do not feel that any intervention is needed, neurology consultation was requested for foot drop Urine cultures are still pending, although reported Proteus mirabilis and some lactose fermented age gram-negative rod. Blood cultures just reported up positive as well for gram-negative rod, ID and sensitivities are pending. Patient is on levofloxacin. Afebrile. White blood cell count is normal Review of Systems  Constitutional: Negative for fever, chills and weight loss.  HENT: Negative for congestion.   Eyes: Negative for blurred vision and double vision.  Respiratory: Positive for shortness of breath. Negative for cough, sputum production and wheezing.   Cardiovascular: Positive for leg swelling. Negative for chest pain, palpitations, orthopnea and PND.  Gastrointestinal: Negative for nausea, vomiting, abdominal pain,  diarrhea, constipation and blood in stool.  Genitourinary: Negative for dysuria, urgency, frequency and hematuria.  Musculoskeletal: Positive for joint pain. Negative for falls.  Neurological: Negative for dizziness, tremors, focal weakness and headaches.  Endo/Heme/Allergies: Does not bruise/bleed easily.  Psychiatric/Behavioral: Negative for depression. The patient does not have insomnia.     VITAL SIGNS: Blood pressure 98/54, pulse 67, temperature 97.5 F (36.4 C), temperature source Oral, resp. rate 18, height  (1.803 m), weight 113.354 kg (249 lb 14.4 oz), SpO2 98 %.  PHYSICAL EXAMINATION:   GENERAL:  65 y.o.-year-old patient lying in the bed with no acute distress.  EYES: Pupils equal, round, reactive to light and accommodation. No scleral icterus. Extraocular muscles intact.  HEENT: Head atraumatic, normocephalic. Oropharynx and nasopharynx clear.  NECK:  Supple, no jugular venous distention. No thyroid enlargement, no tenderness.  LUNGS: Normal breath sounds bilaterally, no wheezing, few rales,rhonchi and crepitations. No use of accessory muscles of respiration.  CARDIOVASCULAR: S1, S2 normal. No murmurs, rubs, or gallops.  ABDOMEN: Soft, nontender, nondistended. Bowel sounds present. No organomegaly or mass.  EXTREMITIES: 2+ lower extremity and pedal edema, no cyanosis, or clubbing.  significantly decreased range of motion in bilateral knees, more on the right side. Painful palpation of the patient's knees and feet, swelling of joints  NEUROLOGIC: Cranial nerves II through XII are intact. Muscle strength 5/5 in all extremities. Sensation intact. Gait not checked. Unable to flex or extend left foot. Diminished sensation to light touch in the left lower extremity, All other extremities. Strength was 5 out of 5,  PSYCHIATRIC: The patient is alert and oriented x 3.  SKIN: No obvious rash, lesion, or ulcer.   ORDERS/RESULTS REVIEWED:   CBC  Recent Labs Lab  06/11/15 1820  06/11/15 2121 06/12/15 0726 06/13/15 0814  WBC 15.0* 20.3* 12.8* 7.8  HGB 12.2* 12.2* 11.9* 12.4*  HCT 39.2* 38.7* 37.2* 38.8*  PLT 411 357 351 361  MCV 90.5 89.8 90.4 89.8  MCH 28.2 28.3 29.0 28.6  MCHC 31.2* 31.5* 32.1 31.9*  RDW 16.9* 16.7* 16.7* 16.7*  LYMPHSABS  --  1.8  --   --   MONOABS  --  2.2*  --   --   EOSABS  --  0.1  --   --   BASOSABS  --  0.1  --   --    ------------------------------------------------------------------------------------------------------------------  Chemistries   Recent Labs Lab 06/11/15 1820 06/11/15 2121 06/12/15 0726 06/13/15 0640 06/14/15 0432 06/15/15 0424  NA 137  --   --  139 136 137  K 3.8  --   --  4.3 4.4 4.3  CL 102  --   --  102 102 102  CO2 26  --   --  30 28 29   GLUCOSE 98  --   --  115* 121* 129*  BUN 32*  --   --  23* 28* 37*  CREATININE 1.94*  --  1.55* 1.38* 1.34* 1.45*  CALCIUM 8.3*  --   --  8.4* 8.1* 8.2*  AST  --  25  --   --   --   --   ALT  --  29  --   --   --   --   ALKPHOS  --  51  --   --   --   --   BILITOT  --  1.3*  --   --   --   --    ------------------------------------------------------------------------------------------------------------------ estimated creatinine clearance is 63.3 mL/min (by C-G formula based on Cr of 1.45). ------------------------------------------------------------------------------------------------------------------ No results for input(s): TSH, T4TOTAL, T3FREE, THYROIDAB in the last 72 hours.  Invalid input(s): FREET3  Cardiac Enzymes  Recent Labs Lab 06/11/15 1820 06/11/15 2121  TROPONINI 0.03 0.06*   ------------------------------------------------------------------------------------------------------------------ Invalid input(s): POCBNP ---------------------------------------------------------------------------------------------------------------  RADIOLOGY: Mr Brain Wo Contrast  06/14/2015  CLINICAL DATA:  Left leg weakness. Lower extremity swelling. Unable  to walk for 3 weeks. Right arm pain and weakness. EXAM: MRI HEAD WITHOUT CONTRAST TECHNIQUE: Multiplanar, multiecho pulse sequences of the brain and surrounding structures were obtained without intravenous contrast. COMPARISON:  None. FINDINGS: There is no evidence of acute infarct, intracranial hemorrhage, mass, midline shift, or extra-axial fluid collection. There is mild cerebral atrophy which is slightly more prominent in the parietal regions. Small foci of T2 hyperintensity scattered throughout the cerebral white matter bilaterally and in the pons are nonspecific but compatible with mild chronic small vessel ischemic disease. Orbits are unremarkable. Paranasal sinuses and mastoid air cells are clear. Major intracranial vascular flow voids are preserved. IMPRESSION: 1. No acute intracranial abnormality. 2. Mild chronic small vessel ischemic disease and cerebral atrophy. Electronically Signed   By: Sebastian Ache M.D.   On: 06/14/2015 14:36   Dg Foot Complete Left  06/14/2015  CLINICAL DATA:  Left foot pain.  No reported injury. EXAM: LEFT FOOT - COMPLETE 3+ VIEW COMPARISON:  None. FINDINGS: No fracture, dislocation or suspicious focal osseous lesion. Lisfranc joint appears intact. There focal cortical erosions at the medial aspect of the talus on the oblique view, with surrounding soft tissue swelling. No pathologic soft tissue calcifications. IMPRESSION: Nonspecific erosive arthritis in the left ankle joint at the medial talus with surrounding soft tissue swelling, cannot exclude septic arthritis of  the left ankle joint. These results will be called to the ordering clinician or representative by the Radiologist Assistant, and communication documented in the PACS or zVision Dashboard. Electronically Signed   By: Delbert Phenix M.D.   On: 06/14/2015 18:20    EKG:  Orders placed or performed during the hospital encounter of 06/11/15  . ED EKG within 10 minutes  . ED EKG within 10 minutes    ASSESSMENT AND  PLAN:  Active Problems:   A-fib (HCC)   Pressure ulcer  #1 acute on chronic systolic CHF, more right-sided than left-sided, continue diuresis, . Patient diuresed approximately 2.8 L since his admission, echocardiogram  revealed ejection fraction of 25-30%, mild mitral regurgitation , not being weaned to room air since patient is chronically on 3 L of oxygen through nasal cannula at home 24 / 7  #2. Acute respiratory failure with hypoxia, due to CHF as well as COPD, continue outpatient medications, diuretics, levofloxacin oraly, blood cultures are negative so far #3. Urinary tract infection due to Proteus mirabilis and second gram-negative rods, identification and susceptibility are still pending, continue levofloxacin, change antibiotic if needed  #4, polyarthropathy due to gout attack, uric  acid level  was high, now off colchicine due to renal insufficiency, continue  Prednisone, improved clinically , physical therapist recommends skilled nursing facility placement  #5. A. fib, RVR, now  Of  Cardizem drip,  continue digoxin, digoxin level recently was 0.6, discontinue Cardizem CD due to hypotension. Advancement may be difficult due to relative hypotension . Starting digoxin orally, follow digoxin level closely, due to renal insufficiency. #6. Leukocytosis, improved with therapy 7. Left  lower extremity weakness, MRI of the brain showed no stroke,  getting neurologist involved for further recommendations, podiatrist consult is appreciated. #8. Hypotension, holding Cardizem CD, initiating short-acting Cardizem at 30 mg every 6 hours, provided blood pressure is below 100 and/or heart rate is below 60 #. Sepsis due to gram-negative rods, gram-negative rod PCR   is still pending, discussed with microbiology today   Management plans discussed with the patient, family and they are in agreement.   DRUG ALLERGIES:  Allergies  Allergen Reactions  . Penicillins Hives, Swelling and Other (See Comments)     Has patient had a PCN reaction causing immediate rash, facial/tongue/throat swelling, SOB or lightheadedness with hypotension: Yes Has patient had a PCN reaction causing severe rash involving mucus membranes or skin necrosis: No Has patient had a PCN reaction that required hospitalization No Has patient had a PCN reaction occurring within the last 10 years: No If all of the above answers are "NO", then may proceed with Cephalosporin use.    CODE STATUS:     Code Status Orders        Start     Ordered   06/12/15 0120  Full code   Continuous     06/12/15 0119    Code Status History    Date Active Date Inactive Code Status Order ID Comments User Context   08/31/2014  7:36 PM 09/08/2014  1:54 PM Full Code [id number]  Adrian Saran, MD Inpatient      TOTAL TIME TAKING CARE OF THIS PATIENT:  45  minutes .  Marland Kitchen Discussed with neurology, microbiology for about 10-15 minutes  Lorcan Shelp M.D on 06/15/2015 at 1:31 PM  Between 7am to 6pm - Pager - [phone]  After 6pm go to [email or web address] - password [email or web address] [facility]  [facility]  Office  [phone]  CC: Primary care physician; Maryellen Pile,  Serita Sheller, MD

## 2015-06-15 NOTE — Progress Notes (Signed)
CSW was informed by MD that patient will not be medically stable for discharge today. CSW informed Jomarie Longs- Admission Coordinator at [facility] to inform him of above. CSW will continue to follow and assist.   Woodroe Mode, MSW, LCSW-A Clinical Social Work Department [phone]

## 2015-06-15 NOTE — Consult Note (Signed)
ORTHOPAEDIC CONSULTATION  REQUESTING PHYSICIAN: Katharina Caper, MD  Chief Complaint: Foot drop left   HPI: George Hopkins is a 65 y.o. male who complains of  Lack of mobility to the left foot.  Started approx 3 weeks ago. No injury.  Noted foot dragging.  C/O pins and needles.  Past Medical History  Diagnosis Date  . Hypertension   . CHF (congestive heart failure) (HCC)   . COPD (chronic obstructive pulmonary disease) (HCC)   . Diverticulitis   . Irregular heart beat   . Gout    Past Surgical History  Procedure Laterality Date  . Cardiac surgery     Social History   Social History  . Marital Status: Single    Spouse Name: N/A  . Number of Children: N/A  . Years of Education: N/A   Occupational History  . disabled    Social History Main Topics  . Smoking status: Former Games developer  . Smokeless tobacco: Never Used  . Alcohol Use: No  . Drug Use: No  . Sexual Activity: Not Asked   Other Topics Concern  . None   Social History Narrative   Family History  Problem Relation Age of Onset  . Dementia Mother    Allergies  Allergen Reactions  . Penicillins Hives, Swelling and Other (See Comments)    Has patient had a PCN reaction causing immediate rash, facial/tongue/throat swelling, SOB or lightheadedness with hypotension: Yes Has patient had a PCN reaction causing severe rash involving mucus membranes or skin necrosis: No Has patient had a PCN reaction that required hospitalization No Has patient had a PCN reaction occurring within the last 10 years: No If all of the above answers are "NO", then may proceed with Cephalosporin use.   Prior to Admission medications   Medication Sig Start Date End Date Taking? Authorizing Provider  carvedilol (COREG) 6.25 MG tablet Take 6.25 mg by mouth 2 (two) times daily.   Yes Historical Provider, MD  colchicine (COLCRYS) 0.6 MG tablet Take 0.6 mg by mouth daily.   Yes Historical Provider, MD  furosemide (LASIX) 80 MG tablet Take  1 tablet (80 mg total) by mouth 2 (two) times daily. 06/03/15 06/02/16 Yes Sharman Cheek, MD  gabapentin (NEURONTIN) 300 MG capsule Take 1 capsule (300 mg total) by mouth 2 (two) times daily. 09/08/14  Yes Richard Renae Gloss, MD  Ipratropium-Albuterol (COMBIVENT) 20-100 MCG/ACT AERS respimat Inhale 1 puff into the lungs every 6 (six) hours as needed for wheezing or shortness of breath.    Yes Historical Provider, MD  lisinopril (PRINIVIL,ZESTRIL) 20 MG tablet Take 20 mg by mouth daily.   Yes Historical Provider, MD  Melatonin 3 MG TABS Take 3 mg by mouth at bedtime as needed (for sleep).   Yes Historical Provider, MD  metoprolol tartrate (LOPRESSOR) 25 MG tablet Take 6.25 mg by mouth every 6 (six) hours.    Yes Historical Provider, MD  oxyCODONE-acetaminophen (PERCOCET/ROXICET) 5-325 MG tablet Take 1 tablet by mouth every 6 (six) hours as needed for severe pain.   Yes Historical Provider, MD  pravastatin (PRAVACHOL) 20 MG tablet Take 20 mg by mouth at bedtime.    Yes Historical Provider, MD  spironolactone (ALDACTONE) 25 MG tablet Take 12.5 mg by mouth 2 (two) times daily.   Yes Historical Provider, MD  tiotropium (SPIRIVA) 18 MCG inhalation capsule Place 18 mcg into inhaler and inhale daily.   Yes Historical Provider, MD  torsemide (DEMADEX) 20 MG tablet Take 20 mg by mouth daily.  Yes Historical Provider, MD  HYDROcodone-acetaminophen (NORCO) 5-325 MG per tablet Take 1-2 tablets by mouth every 4 (four) hours as needed for moderate pain. Patient not taking: Reported on 04/24/2015 01/02/15   Charmayne Sheer Beers, PA-C  naproxen (NAPROSYN) 500 MG tablet Take 1 tablet (500 mg total) by mouth 2 (two) times daily with a meal. Patient not taking: Reported on 04/24/2015 01/02/15   Charmayne Sheer Beers, PA-C  predniSONE (DELTASONE) 20 MG tablet Take 1 tablet (20 mg total) by mouth daily. Patient not taking: Reported on 06/11/2015 05/27/15 05/31/16  Jennye Moccasin, MD   Mr Brain Wo Contrast  06/14/2015  CLINICAL DATA:  Left  leg weakness. Lower extremity swelling. Unable to walk for 3 weeks. Right arm pain and weakness. EXAM: MRI HEAD WITHOUT CONTRAST TECHNIQUE: Multiplanar, multiecho pulse sequences of the brain and surrounding structures were obtained without intravenous contrast. COMPARISON:  None. FINDINGS: There is no evidence of acute infarct, intracranial hemorrhage, mass, midline shift, or extra-axial fluid collection. There is mild cerebral atrophy which is slightly more prominent in the parietal regions. Small foci of T2 hyperintensity scattered throughout the cerebral white matter bilaterally and in the pons are nonspecific but compatible with mild chronic small vessel ischemic disease. Orbits are unremarkable. Paranasal sinuses and mastoid air cells are clear. Major intracranial vascular flow voids are preserved. IMPRESSION: 1. No acute intracranial abnormality. 2. Mild chronic small vessel ischemic disease and cerebral atrophy. Electronically Signed   By: Sebastian Ache M.D.   On: 06/14/2015 14:36   Dg Foot Complete Left  06/14/2015  CLINICAL DATA:  Left foot pain.  No reported injury. EXAM: LEFT FOOT - COMPLETE 3+ VIEW COMPARISON:  None. FINDINGS: No fracture, dislocation or suspicious focal osseous lesion. Lisfranc joint appears intact. There focal cortical erosions at the medial aspect of the talus on the oblique view, with surrounding soft tissue swelling. No pathologic soft tissue calcifications. 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 These results will be called to the ordering clinician or representative by the Radiologist Assistant, and communication documented in the PACS or zVision Dashboard. Electronically Signed   By: Delbert Phenix M.D.   On: 06/14/2015 18:20    Positive ROS: All other systems have been reviewed and were otherwise negative with the exception of those  mentioned in the HPI and as above.  12 point ROS was performed.  Physical Exam: General: Alert and oriented.  No apparent distress.  Vascular:  Left foot:Dorsalis Pedis:  diminished Posterior Tibial:  diminished  Right foot: Dorsalis Pedis:  diminished Posterior Tibial:  diminished  Neuro:sensation intact grossly.  No clonus.  Toes not reflexive with babinski  Derm:No ulcer or skin lesions  Ortho/MS: Right lower leg with good pain free active ROM.  Left lower leg flaccid with no active mobility   Assessment: Foot drop left lower leg  Plan: No concern for tendon rupture or trauma. Recommend c/w PT.  Recommend neurology w/u for foot drop. I can see outpt to discuss dropfoot bracing for stability. Will sign off for now.     Irean Hong, DPM Cell [phone]   06/15/2015 12:03 PM

## 2015-06-15 NOTE — Progress Notes (Signed)
Pharmacy Antibiotic Note  George Hopkins is a 66 y.o. male admitted on 06/11/2015 with sepsis/REspiratory failure, UTI  Pharmacy has been consulted for  Levaquin dosing.  Plan: Day 4- Continue Levaquin 750 mg PO q24 hours- for possible PNA?/UTI   Height:  (180.3 cm) Weight: 249 lb 14.4 oz (113.354 kg) IBW/kg (Calculated) : 75.3  Temp (24hrs), Avg:97.6 F (36.4 C), Min:97.5 F (36.4 C), Max:97.7 F (36.5 C)   Recent Labs Lab 06/11/15 1820 06/11/15 2121 06/12/15 0109 06/12/15 0726 06/13/15 0640 06/13/15 0814 06/14/15 0432 06/15/15 0424  WBC 15.0* 20.3*  --  12.8*  --  7.8  --   --   CREATININE 1.94*  --   --  1.55* 1.38*  --  1.34* 1.45*  LATICACIDVEN  --  1.8 1.0 1.2  --   --   --   --     Estimated Creatinine Clearance: 63.3 mL/min (by C-G formula based on Cr of 1.45).    Allergies  Allergen Reactions  . Penicillins Hives, Swelling and Other (See Comments)    Has patient had a PCN reaction causing immediate rash, facial/tongue/throat swelling, SOB or lightheadedness with hypotension: Yes Has patient had a PCN reaction causing severe rash involving mucus membranes or skin necrosis: No Has patient had a PCN reaction that required hospitalization No Has patient had a PCN reaction occurring within the last 10 years: No If all of the above answers are "NO", then may proceed with Cephalosporin use.    Antimicrobials this admission:   Dose adjustments this admission:   Microbiology results: 06/12/15 BCx: NGTD 06/11/15 UCx:  # 1- Proteus Mirabilis- sens. to all except Nitrofurantoin   #2- GNR- cx pending  Sputum:    MRSA PCR: negative CXR: pulmonary edema UA: LE(+) NO2(-) WBC 6-30  CT renal stone study: 2/21: Cardiomegaly with mosaic ground-glass opacity within the visualized lung bases,    George Hopkins A 06/15/2015 12:03 PM

## 2015-06-15 NOTE — Progress Notes (Signed)
Patient's BP trending low, SBP in 90's.  Spoke to Dr. Winona Legato about d/c lisinopril and cardizem.  She said she planned to adjust medications accordingly and with parameters to meet BP goal.

## 2015-06-16 LAB — BASIC METABOLIC PANEL
Anion gap: 4 — ABNORMAL LOW (ref 5–15)
BUN: 38 mg/dL — AB (ref 6–20)
CALCIUM: 8.1 mg/dL — AB (ref 8.9–10.3)
CO2: 28 mmol/L (ref 22–32)
CREATININE: 1.24 mg/dL (ref 0.61–1.24)
Chloride: 103 mmol/L (ref 101–111)
GFR calc Af Amer: >60 mL/min (ref >60)
GFR, EST NON AFRICAN AMERICAN: 58 mL/min — AB (ref >60)
Glucose, Bld: 115 mg/dL — ABNORMAL HIGH (ref 65–99)
Potassium: 4.3 mmol/L (ref 3.5–5.1)
SODIUM: 135 mmol/L (ref 135–145)

## 2015-06-16 LAB — URINE CULTURE: SPECIAL REQUESTS: NORMAL

## 2015-06-16 MED ORDER — LEVOFLOXACIN 750 MG PO TABS: 750.0000 mg | ORAL_TABLET | Freq: Every day | ORAL | Status: DC

## 2015-06-16 MED ORDER — TAMSULOSIN HCL 0.4 MG PO CAPS: 0.4000 mg | ORAL_CAPSULE | Freq: Every day | ORAL | Status: AC

## 2015-06-16 MED ORDER — LISINOPRIL 2.5 MG PO TABS: 20.0000 mg | ORAL_TABLET | Freq: Every day | ORAL | Status: DC

## 2015-06-16 MED ORDER — FUROSEMIDE 80 MG PO TABS: 40.0000 mg | ORAL_TABLET | Freq: Two times a day (BID) | ORAL | Status: AC

## 2015-06-16 MED ORDER — HYDROCODONE-ACETAMINOPHEN 5-325 MG PO TABS: 1.0000 | ORAL_TABLET | ORAL | Status: AC | PRN

## 2015-06-16 MED ORDER — PREDNISONE 10 MG PO TABS: ORAL_TABLET | ORAL | Status: DC

## 2015-06-16 MED ORDER — CARVEDILOL 3.125 MG PO TABS: 3.1250 mg | ORAL_TABLET | Freq: Two times a day (BID) | ORAL | Status: AC

## 2015-06-16 MED ORDER — OXYCODONE-ACETAMINOPHEN 5-325 MG PO TABS: 1.0000 | ORAL_TABLET | Freq: Four times a day (QID) | ORAL | Status: AC | PRN

## 2015-06-16 MED ORDER — LEVOFLOXACIN 750 MG PO TABS: 750.0000 mg | ORAL_TABLET | Freq: Every day | ORAL | Status: AC

## 2015-06-16 MED ORDER — ASPIRIN 81 MG PO TBEC: 81.0000 mg | DELAYED_RELEASE_TABLET | Freq: Every day | ORAL | Status: AC

## 2015-06-16 MED ORDER — TAMSULOSIN HCL 0.4 MG PO CAPS
0.4000 mg | ORAL_CAPSULE | Freq: Every day | ORAL | Status: DC
  Administered 2015-06-16: 0.4 mg via ORAL
  Filled 2015-06-16: qty 1

## 2015-06-16 NOTE — Discharge Summary (Signed)
[facility] - [place] at [facility]   PATIENT NAME: George Hopkins    MR#:  [id number]  DATE OF BIRTH:  11-07-1950  DATE OF ADMISSION:  06/11/2015 ADMITTING PHYSICIAN: Ihor Austin, MD  DATE OF DISCHARGE: 06/16/2015  PRIMARY CARE PHYSICIAN: Derwood Kaplan, MD    ADMISSION DIAGNOSIS:  Other specified hypotension [I95.89] Renal stone [N20.0] Elevated troponin [R79.89] Atrial fibrillation with rapid ventricular response (HCC) [I48.91]  DISCHARGE DIAGNOSIS:  Active Problems:   A-fib (HCC)   Pressure ulcer   Left leg weakness   SECONDARY DIAGNOSIS:   Past Medical History  Diagnosis Date  . Hypertension   . CHF (congestive heart failure) (HCC)   . COPD (chronic obstructive pulmonary disease) (HCC)   . Diverticulitis   . Irregular heart beat   . Gout     HOSPITAL COURSE:   65 year old male with past medical history of chronic systolic CHF, COPD, chronic atrial fibrillation, hypertension, gout, osteoarthritis, GERD who presented to the hospital due to shortness of breath and noted to be in CHF.  #1 CHF-this was acute on chronic systolic dysfunction. Patient was admitted to the hospital and started on aggressive IV diuresis with Lasix. He responded well to the IV diuresis and is currently about 3 L negative since admission and feels better. -She has some relative hypotension therefore his CHF regimen has to be adjusted. -Presently is being discharged on low-dose Coreg, Ace, Lasix, Aldactone and torsemide. -He should follow up with the CHF clinic further titrations of his medications. -Patient did have echocardiogram done while in this hospital which showed EF of 25-30%.  #2 acute respiratory failure with hypoxia-this was secondary to a combination of COPD and CHF. -Patient was treated with IV diuresis with Lasix and has responded well is clinically doing better. -COPD patient was given IV steroids and nebulizer treatments and given empiric  Levaquin. He has currently no wheezing and bronchospasm. He is being discharged on oral prednisone taper and Levaquin for another few days.  #3 urinary tract infection-patient was noted to have a Proteus and Citrobacter UTI. -Patient is being discharged on oral Levaquin for a few more days.  #4 urinary retention-patient prior to this hospitalization was presented to the ER with urinary retention and had a Foley catheter placed. Presently on going to remove his Foley catheter start him on some Flomax. -If he continues to have urinary retention he needs urology follow-up and will need to be discharged with a Foley catheter.  #5 polyarticular gout-patient had some mild acute renal failure therefore could not tolerate his colchicine. He was placed on a prednisone taper and has improved. His renal function is back to baseline and he can resume his colchicine now.  #6 Atrial fibrillation with rapid ventricular response-patient initially was on a Cardizem drip and then weaned off of it. He has some relative hypotension therefore he was given some digoxin. -His legs has significantly improved and he will continue some low-dose Coreg as mentioned. He was started on some Cardizem but due to the relative hypotension that is not being discontinued.  #7 left lower extremity weakness-patient has a left foot drop which she's had for a few weeks. He was seen by podiatry who did not recommend any acute intervention. A neurology consult was also obtained for MRI of his lumbar spine which showed no evidence of acute abnormality other than chronic DJD. The recommended getting a outpatient EMG and ongoing physical therapy which she will get at the skilled nursing facility  upon discharge.  #8 COPD-patient did have a mild acute exacerbation which has not improved. He is being discharged on oral prednisone taper and Levaquin. He will continue his Combivent and Spiriva.  Patient was seen by physical therapy and recommended  short-term rehabilitation which is where he is presently being discharged.   DISCHARGE CONDITIONS:   Stable  CONSULTS OBTAINED:  Treatment Team:  Dalia Heading, MD Kym Groom, MD  DRUG ALLERGIES:   Allergies  Allergen Reactions  . Penicillins Hives, Swelling and Other (See Comments)    Has patient had a PCN reaction causing immediate rash, facial/tongue/throat swelling, SOB or lightheadedness with hypotension: Yes Has patient had a PCN reaction causing severe rash involving mucus membranes or skin necrosis: No Has patient had a PCN reaction that required hospitalization No Has patient had a PCN reaction occurring within the last 10 years: No If all of the above answers are "NO", then may proceed with Cephalosporin use.    DISCHARGE MEDICATIONS:   Current Discharge Medication List    START taking these medications   Details  aspirin EC 81 MG EC tablet Take 1 tablet (81 mg total) by mouth daily.    levofloxacin (LEVAQUIN) 750 MG tablet Take 1 tablet (750 mg total) by mouth daily. Qty: 3 tablet    tamsulosin (FLOMAX) 0.4 MG CAPS capsule Take 1 capsule (0.4 mg total) by mouth daily. Qty: 30 capsule      CONTINUE these medications which have CHANGED   Details  carvedilol (COREG) 3.125 MG tablet Take 1 tablet (3.125 mg total) by mouth 2 (two) times daily.    furosemide (LASIX) 80 MG tablet Take 0.5 tablets (40 mg total) by mouth 2 (two) times daily. Qty: 60 tablet, Refills: 0    HYDROcodone-acetaminophen (NORCO) 5-325 MG tablet Take 1-2 tablets by mouth every 4 (four) hours as needed for moderate pain. Qty: 15 tablet, Refills: 0    lisinopril (PRINIVIL,ZESTRIL) 2.5 MG tablet Take 8 tablets (20 mg total) by mouth daily.    oxyCODONE-acetaminophen (PERCOCET/ROXICET) 5-325 MG tablet Take 1 tablet by mouth every 6 (six) hours as needed for severe pain. Qty: 30 tablet, Refills: 0    predniSONE (DELTASONE) 10 MG tablet Label  & dispense according to the schedule  below. 5 Pills PO for 1 day then, 4 Pills PO for 1 day, 3 Pills PO for 1 day, 2 Pills PO for 1 day, 1 Pill PO for 1 days then STOP. Qty: 15 tablet, Refills: 0      CONTINUE these medications which have NOT CHANGED   Details  colchicine (COLCRYS) 0.6 MG tablet Take 0.6 mg by mouth daily.    gabapentin (NEURONTIN) 300 MG capsule Take 1 capsule (300 mg total) by mouth 2 (two) times daily. Qty: 60 capsule, Refills: 0    Ipratropium-Albuterol (COMBIVENT) 20-100 MCG/ACT AERS respimat Inhale 1 puff into the lungs every 6 (six) hours as needed for wheezing or shortness of breath.     Melatonin 3 MG TABS Take 3 mg by mouth at bedtime as needed (for sleep).    pravastatin (PRAVACHOL) 20 MG tablet Take 20 mg by mouth at bedtime.     spironolactone (ALDACTONE) 25 MG tablet Take 12.5 mg by mouth 2 (two) times daily.    tiotropium (SPIRIVA) 18 MCG inhalation capsule Place 18 mcg into inhaler and inhale daily.    torsemide (DEMADEX) 20 MG tablet Take 20 mg by mouth daily.      STOP taking these medications  metoprolol tartrate (LOPRESSOR) 25 MG tablet      naproxen (NAPROSYN) 500 MG tablet          DISCHARGE INSTRUCTIONS:   DIET:  Cardiac diet  DISCHARGE CONDITION:  Stable  ACTIVITY:  Activity as tolerated  OXYGEN:  Home Oxygen: No.   Oxygen Delivery: room air  DISCHARGE LOCATION:  nursing home   If you experience worsening of your admission symptoms, develop shortness of breath, life threatening emergency, suicidal or homicidal thoughts you must seek medical attention immediately by calling 911 or calling your MD immediately  if symptoms less severe.  You Must read complete instructions/literature along with all the possible adverse reactions/side effects for all the Medicines you take and that have been prescribed to you. Take any new Medicines after you have completely understood and accpet all the possible adverse reactions/side effects.   Please note  You were  cared for by a hospitalist during your hospital stay. If you have any questions about your discharge medications or the care you received while you were in the hospital after you are discharged, you can call the unit and asked to speak with the hospitalist on call if the hospitalist that took care of you is not available. Once you are discharged, your primary care physician will handle any further medical issues. Please note that NO REFILLS for any discharge medications will be authorized once you are discharged, as it is imperative that you return to your primary care physician (or establish a relationship with a primary care physician if you do not have one) for your aftercare needs so that they can reassess your need for medications and monitor your lab values.     Today   Shortness of breath improved. No wheezing, bronchospasm. No chest pain. Still has left foot weakness.  VITAL SIGNS:  Blood pressure 106/56, pulse 77, temperature 97.6 F (36.4 C), temperature source Oral, resp. rate 20, height  (1.803 m), weight 113.309 kg (249 lb 12.8 oz), SpO2 100 %.  I/O:   Intake/Output Summary (Last 24 hours) at 06/16/15 0952 Last data filed at 06/16/15 0453  Gross per 24 hour  Intake    480 ml  Output   1225 ml  Net   -745 ml    PHYSICAL EXAMINATION:  GENERAL:  65 y.o.-year-old patient lying in the bed with no acute distress.  EYES: Pupils equal, round, reactive to light and accommodation. No scleral icterus. Extraocular muscles intact.  HEENT: Head atraumatic, normocephalic. Oropharynx and nasopharynx clear.  NECK:  Supple, no jugular venous distention. No thyroid enlargement, no tenderness.  LUNGS: Normal breath sounds bilaterally, no wheezing, rales,rhonchi. No use of accessory muscles of respiration.  CARDIOVASCULAR: S1, S2 normal. No murmurs, rubs, or gallops.  ABDOMEN: Soft, non-tender, non-distended. Bowel sounds present. No organomegaly or mass.  EXTREMITIES: No pedal edema,  cyanosis, or clubbing.  NEUROLOGIC: Cranial nerves II through XII are intact. No focal motor or sensory defecits b/l. Left foot weakness with mild food drop.    PSYCHIATRIC: The patient is alert and oriented x 3. Good affect.  SKIN: No obvious rash, lesion, or ulcer.   DATA REVIEW:   CBC  Recent Labs Lab 06/13/15 0814  WBC 7.8  HGB 12.4*  HCT 38.8*  PLT 361    Chemistries   Recent Labs Lab 06/11/15 2121  06/16/15 0415  NA  --   < > 135  K  --   < > 4.3  CL  --   < >  103  CO2  --   < > 28  GLUCOSE  --   < > 115*  BUN  --   < > 38*  CREATININE  --   < > 1.24  CALCIUM  --   < > 8.1*  AST 25  --   --   ALT 29  --   --   ALKPHOS 51  --   --   BILITOT 1.3*  --   --   < > = values in this interval not displayed.  Cardiac Enzymes  Recent Labs Lab 06/11/15 2121  TROPONINI 0.06*    Microbiology Results  Results for orders placed or performed during the hospital encounter of 06/11/15  Urine culture     Status: None   Collection Time: 06/11/15  9:21 PM  Result Value Ref Range Status   Specimen Description URINE, RANDOM  Final   Special Requests Normal  Final   Culture   Final    >=100,000 COLONIES/mL PROTEUS MIRABILIS >=100,000 COLONIES/mL CITROBACTER FREUNDII MULTIPLE SPECIES PRESENT, SUGGEST RECOLLECTION    Report Status 06/16/2015 FINAL  Final   Organism ID, Bacteria PROTEUS MIRABILIS  Final   Organism ID, Bacteria CITROBACTER FREUNDII  Final      Susceptibility   Citrobacter freundii - MIC*    CEFAZOLIN >=64 RESISTANT Resistant     CEFTRIAXONE <=1 SENSITIVE Sensitive     CIPROFLOXACIN <=0.25 SENSITIVE Sensitive     GENTAMICIN <=1 SENSITIVE Sensitive     IMIPENEM 1 SENSITIVE Sensitive     NITROFURANTOIN <=16 SENSITIVE Sensitive     TRIMETH/SULFA <=20 SENSITIVE Sensitive     PIP/TAZO <=4 SENSITIVE Sensitive     * >=100,000 COLONIES/mL CITROBACTER FREUNDII   Proteus mirabilis - MIC*    AMPICILLIN <=2 SENSITIVE Sensitive     CEFAZOLIN 8 SENSITIVE  Sensitive     CEFTRIAXONE <=1 SENSITIVE Sensitive     CIPROFLOXACIN <=0.25 SENSITIVE Sensitive     GENTAMICIN <=1 SENSITIVE Sensitive     IMIPENEM 2 SENSITIVE Sensitive     NITROFURANTOIN 128 RESISTANT Resistant     TRIMETH/SULFA <=20 SENSITIVE Sensitive     AMPICILLIN/SULBACTAM <=2 SENSITIVE Sensitive     PIP/TAZO <=4 SENSITIVE Sensitive     * >=100,000 COLONIES/mL PROTEUS MIRABILIS  Blood culture (routine x 2)     Status: None (Preliminary result)   Collection Time: 06/12/15 12:57 AM  Result Value Ref Range Status   Specimen Description BLOOD BLOOD RIGHT FOREARM  Final   Special Requests BOTTLES DRAWN AEROBIC AND ANAEROBIC  Final   Culture NO GROWTH 3 DAYS  Final   Report Status PENDING  Incomplete  Blood culture (routine x 2)     Status: None (Preliminary result)   Collection Time: 06/12/15  1:08 AM  Result Value Ref Range Status   Specimen Description BLOOD RIGHT HAND  Final   Special Requests   Final    BOTTLES DRAWN AEROBIC AND ANAEROBIC ,   Culture NO GROWTH 3 DAYS  Final   Report Status PENDING  Incomplete  MRSA PCR Screening     Status: None   Collection Time: 06/12/15  8:17 AM  Result Value Ref Range Status   MRSA by PCR NEGATIVE NEGATIVE Final    Comment:        The GeneXpert MRSA Assay (FDA approved for NASAL specimens only), is one component of a comprehensive MRSA colonization surveillance program. It is not intended to diagnose MRSA infection nor to guide or monitor treatment  for MRSA infections.     RADIOLOGY:  Mr Sherrin Daisy Contrast  06/14/2015  CLINICAL DATA:  Left leg weakness. Lower extremity swelling. Unable to walk for 3 weeks. Right arm pain and weakness. EXAM: MRI HEAD WITHOUT CONTRAST TECHNIQUE: Multiplanar, multiecho pulse sequences of the brain and surrounding structures were obtained without intravenous contrast. COMPARISON:  None. FINDINGS: There is no evidence of acute infarct, intracranial hemorrhage, mass, midline  shift, or extra-axial fluid collection. There is mild cerebral atrophy which is slightly more prominent in the parietal regions. Small foci of T2 hyperintensity scattered throughout the cerebral white matter bilaterally and in the pons are nonspecific but compatible with mild chronic small vessel ischemic disease. Orbits are unremarkable. Paranasal sinuses and mastoid air cells are clear. Major intracranial vascular flow voids are preserved. IMPRESSION: 1. No acute intracranial abnormality. 2. Mild chronic small vessel ischemic disease and cerebral atrophy. Electronically Signed   By: Sebastian Ache M.D.   On: 06/14/2015 14:36   Mr Lumbar Spine Wo Contrast  06/15/2015  CLINICAL DATA:  Progressive weakness in the left lower extremity. EXAM: MRI LUMBAR SPINE WITHOUT CONTRAST TECHNIQUE: Multiplanar, multisequence MR imaging of the lumbar spine was performed. No intravenous contrast was administered. COMPARISON:  06/12/2015 FINDINGS: The lowest lumbar type non-rib-bearing vertebra is labeled as L5. The conus medullaris appears normal. Conus level: A L1. In scarring in the left mid kidney and left kidney lower pole. Intervertebral disc desiccation is observed at all levels between L1 and S1. No vertebral subluxation is observed. Additional findings at individual levels are as follows: T12-L1: Unremarkable. L1-2: Mild displacement of the left L1 nerve in the lateral extraforaminal space due to lateral extraforaminal disc protrusion superimposed on disc bulge. Mild left subarticular lateral recess stenosis. Small bilateral facet joint effusions. L2-3:  No impingement.  Disc bulge. L3-4: Mild displacement of both L2 nerves in the lateral extraforaminal space along with borderline bilateral subarticular lateral recess stenosis and borderline bilateral foraminal stenosis due to disc bulge and facet arthropathy in addition to intervertebral spurring. L4-5: Mild right and borderline left foraminal stenosis with mild bilateral  subarticular lateral recess stenosis due to disc bulge and facet arthropathy. Small bilateral facet joint effusions. L5-S1: Borderline left subarticular lateral recess stenosis due to disc bulge and left greater than right facet arthropathy. IMPRESSION: 1. Lumbar spondylosis and degenerative disc disease, causing mild impingement at L1- 2, L3-4, and L4-5, as detailed above. 2. No significant clumping of nerve roots to suggest arachnoiditis. 3. Scarring in the left kidney. Electronically Signed   By: Gaylyn Rong M.D.   On: 06/15/2015 17:14   Dg Foot Complete Left  06/14/2015  CLINICAL DATA:  Left foot pain.  No reported injury. EXAM: LEFT FOOT - COMPLETE 3+ VIEW COMPARISON:  None. FINDINGS: No fracture, dislocation or suspicious focal osseous lesion. Lisfranc joint appears intact. There focal cortical erosions at the medial aspect of the talus on the oblique view, with surrounding soft tissue swelling. No pathologic soft tissue calcifications. 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 These results will be called to the ordering clinician or representative by the Radiologist Assistant, and communication documented in the PACS or zVision Dashboard. Electronically Signed   By: Delbert Phenix M.D.   On: 06/14/2015 18:20      Management plans discussed with the patient, family and they are in agreement.  CODE STATUS:  Code Status Orders        Start     Ordered   06/12/15 0120  Full code   Continuous     06/12/15 0119    Code Status History    Date Active Date Inactive Code Status Order ID Comments User Context   08/31/2014  7:36 PM 09/08/2014  1:54 PM Full Code [id number]  Adrian Saran, MD Inpatient      TOTAL TIME TAKING CARE OF THIS PATIENT: 45 minutes.    Houston Siren M.D on 06/16/2015 at 9:52 AM  Between 7am to 6pm - Pager -  [phone]  After 6pm go to [email or web address] - password [email or web address] [facility]  [facility]  Office  [phone]  CC: Primary care physician; Derwood Kaplan, MD

## 2015-06-16 NOTE — Clinical Social Work Placement (Signed)
   CLINICAL SOCIAL WORK PLACEMENT  NOTE  Date:  06/16/2015  Patient Details  Name: George Hopkins MRN: [id number] Date of Birth: 1950-05-19  Clinical Social Work is seeking post-discharge placement for this patient at the Skilled  Nursing Facility level of care (*CSW will initial, date and re-position this form in  chart as items are completed):  Yes   Patient/family provided with [place] Clinical Social Work Department's list of facilities offering this level of care within the geographic area requested by the patient (or if unable, by the patient's family).  Yes   Patient/family informed of their freedom to choose among providers that offer the needed level of care, that participate in Medicare, Medicaid or managed care program needed by the patient, have an available bed and are willing to accept the patient.  Yes   Patient/family informed of [facility]'s ownership interest in [facility] and [facility], as well as of the fact that they are under no obligation to receive care at these facilities.  PASRR submitted to EDS on       PASRR number received on       Existing PASRR number confirmed on 06/14/15     FL2 transmitted to all facilities in geographic area requested by pt/family on 06/14/15     FL2 transmitted to all facilities within larger geographic area on       Patient informed that his/her managed care company has contracts with or will negotiate with certain facilities, including the following:        Yes   Patient/family informed of bed offers received.  Patient chooses bed at  ([facility])     Physician recommends and patient chooses bed at      Patient to be transferred to  ([facility]) on  .  Patient to be transferred to facility by  [facility])     Patient family notified on 06/16/15 of transfer.  Name of family member notified:  sister Peggy      PHYSICIAN       Additional Comment:     _______________________________________________ Soundra Pilon, LCSW 06/16/2015, 10:37 AM

## 2015-06-16 NOTE — Progress Notes (Signed)
3 L of oxygen. A fib. Takes meds ok. Up to chair and tolearted it well. Foley was d/c at 1000. Pt does not report any pain. Pt has no further concerns at this time.

## 2015-06-16 NOTE — Progress Notes (Signed)
Clinical Social Worker informed by Houston Siren,  MD that patient is medically ready to discharge to SNF for STR, Patient is in a agreement with plan and has informed his sister Gigi Gin.  Call to [facility] to confirm that patient's bed is ready. Provided patient's room number 711 and number to call for report [phone] . All discharge information faxed to  Facility. Rx's added to discharge packet.   RN will call report and patient will discharge to [facility] via EMS.  Sammuel Hines. LCSWA Clinical Social Work Department [phone]  10:55 AM

## 2015-06-16 NOTE — Progress Notes (Signed)
[facility] [place] at [facility]   PATIENT NAME: George Hopkins    MR#:  [id number]  DATE OF BIRTH:  07/05/1950  SUBJECTIVE:   No shortness of breath. Leg swelling has improved.  Noted to have UTI but afebrile, hemodynamically stable.  Seen By Neurology and CT lumbar spine showing no acute pathology that requires intervention.  Still has the right foot drop.    REVIEW OF SYSTEMS:    Review of Systems  Constitutional: Negative for fever and chills.  HENT: Negative for congestion and tinnitus.   Eyes: Negative for blurred vision and double vision.  Respiratory: Negative for cough, shortness of breath and wheezing.   Cardiovascular: Negative for chest pain, orthopnea and PND.  Gastrointestinal: Negative for nausea, vomiting, abdominal pain and diarrhea.  Genitourinary: Negative for dysuria and hematuria.  Neurological: Negative for dizziness, sensory change and focal weakness.       Right foot drop.   All other systems reviewed and are negative.   Nutrition: Heart Healthy Tolerating Diet: Yes Tolerating PT:  Eval noted.    DRUG ALLERGIES:   Allergies  Allergen Reactions  . Penicillins Hives, Swelling and Other (See Comments)    Has patient had a PCN reaction causing immediate rash, facial/tongue/throat swelling, SOB or lightheadedness with hypotension: Yes Has patient had a PCN reaction causing severe rash involving mucus membranes or skin necrosis: No Has patient had a PCN reaction that required hospitalization No Has patient had a PCN reaction occurring within the last 10 years: No If all of the above answers are "NO", then may proceed with Cephalosporin use.    VITALS:  Blood pressure 95/60, pulse 82, temperature 97.5 F (36.4 C), temperature source Oral, resp. rate 18, height  (1.803 m), weight 113.309 kg (249 lb 12.8 oz), SpO2 100 %.  PHYSICAL EXAMINATION:   Physical Exam  GENERAL:  65 y.o.-year-old patient lying in the bed with  no acute distress.  EYES: Pupils equal, round, reactive to light and accommodation. No scleral icterus. Extraocular muscles intact.  HEENT: Head atraumatic, normocephalic. Oropharynx and nasopharynx clear.  NECK:  Supple, no jugular venous distention. No thyroid enlargement, no tenderness.  LUNGS: Normal breath sounds bilaterally, no wheezing, rales, rhonchi. No use of accessory muscles of respiration.  CARDIOVASCULAR: S1, S2 normal. No murmurs, rubs, or gallops.  ABDOMEN: Soft, nontender, nondistended. Bowel sounds present. No organomegaly or mass.  EXTREMITIES: No cyanosis, clubbing or edema b/l.    NEUROLOGIC: Cranial nerves II through XII are intact. No focal Motor or sensory deficits b/l.  Right foot drop.   PSYCHIATRIC: The patient is alert and oriented x 3.  SKIN: No obvious rash, lesion, or ulcer.    LABORATORY PANEL:   CBC  Recent Labs Lab 06/13/15 0814  WBC 7.8  HGB 12.4*  HCT 38.8*  PLT 361   ------------------------------------------------------------------------------------------------------------------  Chemistries   Recent Labs Lab 06/11/15 2121  06/16/15 0415  NA  --   < > 135  K  --   < > 4.3  CL  --   < > 103  CO2  --   < > 28  GLUCOSE  --   < > 115*  BUN  --   < > 38*  CREATININE  --   < > 1.24  CALCIUM  --   < > 8.1*  AST 25  --   --   ALT 29  --   --   ALKPHOS 51  --   --  BILITOT 1.3*  --   --   < > = values in this interval not displayed. ------------------------------------------------------------------------------------------------------------------  Cardiac Enzymes  Recent Labs Lab 06/11/15 2121  TROPONINI 0.06*   ------------------------------------------------------------------------------------------------------------------  RADIOLOGY:  Mr Lumbar Spine Wo Contrast  06/15/2015  CLINICAL DATA:  Progressive weakness in the left lower extremity. EXAM: MRI LUMBAR SPINE WITHOUT CONTRAST TECHNIQUE: Multiplanar, multisequence MR imaging  of the lumbar spine was performed. No intravenous contrast was administered. COMPARISON:  06/12/2015 FINDINGS: The lowest lumbar type non-rib-bearing vertebra is labeled as L5. The conus medullaris appears normal. Conus level: A L1. In scarring in the left mid kidney and left kidney lower pole. Intervertebral disc desiccation is observed at all levels between L1 and S1. No vertebral subluxation is observed. Additional findings at individual levels are as follows: T12-L1: Unremarkable. L1-2: Mild displacement of the left L1 nerve in the lateral extraforaminal space due to lateral extraforaminal disc protrusion superimposed on disc bulge. Mild left subarticular lateral recess stenosis. Small bilateral facet joint effusions. L2-3:  No impingement.  Disc bulge. L3-4: Mild displacement of both L2 nerves in the lateral extraforaminal space along with borderline bilateral subarticular lateral recess stenosis and borderline bilateral foraminal stenosis due to disc bulge and facet arthropathy in addition to intervertebral spurring. L4-5: Mild right and borderline left foraminal stenosis with mild bilateral subarticular lateral recess stenosis due to disc bulge and facet arthropathy. Small bilateral facet joint effusions. L5-S1: Borderline left subarticular lateral recess stenosis due to disc bulge and left greater than right facet arthropathy. IMPRESSION: 1. Lumbar spondylosis and degenerative disc disease, causing mild impingement at L1- 2, L3-4, and L4-5, as detailed above. 2. No significant clumping of nerve roots to suggest arachnoiditis. 3. Scarring in the left kidney. Electronically Signed   By: Gaylyn Rong M.D.   On: 06/15/2015 17:14   Dg Foot Complete Left  06/14/2015  CLINICAL DATA:  Left foot pain.  No reported injury. EXAM: LEFT FOOT - COMPLETE 3+ VIEW COMPARISON:  None. FINDINGS: No fracture, dislocation or suspicious focal osseous lesion. Lisfranc joint appears intact. There focal cortical erosions at  the medial aspect of the talus on the oblique view, with surrounding soft tissue swelling. No pathologic soft tissue calcifications. 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 These results will be called to the ordering clinician or representative by the Radiologist Assistant, and communication documented in the PACS or zVision Dashboard. Electronically Signed   By: Delbert Phenix M.D.   On: 06/14/2015 18:20     ASSESSMENT AND PLAN:   65 year old male with past medical history of chronic systolic CHF, COPD, chronic atrial fibrillation, hypertension, gout, osteoarthritis, GERD who presented to the hospital due to shortness of breath and noted to be in CHF.  #1 CHF-this was acute on chronic systolic dysfunction. Patient was admitted to the hospital and started on aggressive IV diuresis with Lasix. He responded well to the IV diuresis and is currently about 3 L negative since admission and feels better. -he has some relative hypotension therefore his CHF regimen had to be adjusted. -cont. Low dose Coreg, Ace, Lasix, Aldactone and torsemide. -He should follow up with the CHF clinic further titrations of his medications. -Patient did have echocardiogram done while in this hospital which showed EF of 25-30%.  #2 acute respiratory failure with hypoxia-this was secondary to a combination of COPD and CHF. -Patient was treated with  IV diuresis with Lasix and has responded well is clinically doing better. -for his COPD patient is on IV steroids, DuoNeb nebs around-the-clock, Levaquin.Marland Kitchen He has currently no wheezing and bronchospasm.   #3 urinary tract infection-patient was noted to have a Proteus and Citrobacter UTI. -Continue Levaquin and it sensitive to it.  #4 urinary retention-patient prior to this hospitalization was presented to the ER with urinary retention and had  a Foley catheter placed. I am going to remove his Foley catheter start him on some Flomax. -If he continues to have urinary retention he needs urology follow-up and will need to be discharged with a Foley catheter.  #5 polyarticular gout-patient had some mild acute renal failure therefore could not tolerate colchicine.  - cont. Prednisone.  Can resume Cochicine upon discharge as renal function has improved.   #6 Atrial fibrillation with rapid ventricular response-rate controlled on Dig, Cardizem, Metoprolol.  - cont. ASA.    #7 left lower extremity weakness-patient has a left foot drop which he's had for a few weeks. He was seen by podiatry who did not recommend any acute intervention. A neurology consult was also obtained and they obtained an MRI of his lumbar spine which showed no evidence of acute abnormality other than chronic DJD.  - will need outpatient EMG, ongoing PT and follow up w/ Neurology as outpatient.   #8 COPD - cont. Duonebs, Prednisone, Spiriva.       All the records are reviewed and case discussed with Care Management/Social Workerr. Management plans discussed with the patient, family and they are in agreement.  CODE STATUS: Full  DVT Prophylaxis: Lovenox  TOTAL TIME TAKING CARE OF THIS PATIENT: 30 minutes.   POSSIBLE D/C IN 1-2 DAYS, DEPENDING ON CLINICAL CONDITION.   Houston Siren M.D on 06/16/2015 at 3:50 PM  Between 7am to 6pm - Pager - [phone]  After 6pm go to [email or web address] - password [email or web address] [facility]  [facility]  Office  [phone]  CC: Primary care physician; Derwood Kaplan, MD

## 2015-06-16 NOTE — Progress Notes (Signed)
Report called to [facility] RN at [facility]. EMS to transport. No further orders. IV and tele removed.

## 2015-06-16 NOTE — Consult Note (Signed)
Reason for Consult:Left leg weakness Referring Physician: Winona Legato  CC: Left leg weakness   HPI: George Hopkins is an 65 y.o. male who reports that about 3 weeks ago he became unable to walk around well due to left leg weakness.  He reports that he may have had some symptoms earlier than that because when he was in therapy he noted that his left leg did not look like his right leg.  He has no appreciable numbness.  He reports no upper extremity symptoms.    Past Medical History  Diagnosis Date  . Hypertension   . CHF (congestive heart failure) (HCC)   . COPD (chronic obstructive pulmonary disease) (HCC)   . Diverticulitis   . Irregular heart beat   . Gout     Past Surgical History  Procedure Laterality Date  . Cardiac surgery      Family History  Problem Relation Age of Onset  . Dementia Mother     Social History:  reports that he has quit smoking. He has never used smokeless tobacco. He reports that he does not drink alcohol or use illicit drugs.  Allergies  Allergen Reactions  . Penicillins Hives, Swelling and Other (See Comments)    Has patient had a PCN reaction causing immediate rash, facial/tongue/throat swelling, SOB or lightheadedness with hypotension: Yes Has patient had a PCN reaction causing severe rash involving mucus membranes or skin necrosis: No Has patient had a PCN reaction that required hospitalization No Has patient had a PCN reaction occurring within the last 10 years: No If all of the above answers are "NO", then may proceed with Cephalosporin use.    Medications:  I have reviewed the patient's current medications. Prior to Admission:  Prescriptions prior to admission  Medication Sig Dispense Refill Last Dose  . colchicine (COLCRYS) 0.6 MG tablet Take 0.6 mg by mouth daily.   06/11/2015 at Unknown time  . gabapentin (NEURONTIN) 300 MG capsule Take 1 capsule (300 mg total) by mouth 2 (two) times daily. 60 capsule 0 06/11/2015 at Unknown time  .  Ipratropium-Albuterol (COMBIVENT) 20-100 MCG/ACT AERS respimat Inhale 1 puff into the lungs every 6 (six) hours as needed for wheezing or shortness of breath.    06/11/2015 at Unknown time  . Melatonin 3 MG TABS Take 3 mg by mouth at bedtime as needed (for sleep).   06/10/2015 at Unknown time  . metoprolol tartrate (LOPRESSOR) 25 MG tablet Take 6.25 mg by mouth every 6 (six) hours.    06/11/2015 at 0900  . pravastatin (PRAVACHOL) 20 MG tablet Take 20 mg by mouth at bedtime.    06/10/2015 at Unknown time  . spironolactone (ALDACTONE) 25 MG tablet Take 12.5 mg by mouth 2 (two) times daily.   06/11/2015 at Unknown time  . tiotropium (SPIRIVA) 18 MCG inhalation capsule Place 18 mcg into inhaler and inhale daily.   06/11/2015 at Unknown time  . torsemide (DEMADEX) 20 MG tablet Take 20 mg by mouth daily.   06/11/2015 at Unknown time  . [DISCONTINUED] carvedilol (COREG) 6.25 MG tablet Take 6.25 mg by mouth 2 (two) times daily.   06/11/2015 at 0900  . [DISCONTINUED] furosemide (LASIX) 80 MG tablet Take 1 tablet (80 mg total) by mouth 2 (two) times daily. 60 tablet 0 06/11/2015 at Unknown time  . [DISCONTINUED] lisinopril (PRINIVIL,ZESTRIL) 20 MG tablet Take 20 mg by mouth daily.   06/11/2015 at Unknown time  . [DISCONTINUED] oxyCODONE-acetaminophen (PERCOCET/ROXICET) 5-325 MG tablet Take 1 tablet by mouth every  6 (six) hours as needed for severe pain.   06/11/2015 at 0900  . naproxen (NAPROSYN) 500 MG tablet Take 1 tablet (500 mg total) by mouth 2 (two) times daily with a meal. (Patient not taking: Reported on 04/24/2015) 60 tablet 0   . predniSONE (DELTASONE) 20 MG tablet Take 1 tablet (20 mg total) by mouth daily. (Patient not taking: Reported on 06/11/2015) 5 tablet 0   . [DISCONTINUED] HYDROcodone-acetaminophen (NORCO) 5-325 MG per tablet Take 1-2 tablets by mouth every 4 (four) hours as needed for moderate pain. (Patient not taking: Reported on 04/24/2015) 15 tablet 0    Scheduled: . aspirin EC  81 mg Oral Daily  .  digoxin  0.125 mg Oral Q48H  . diltiazem  30 mg Oral 4 times per day  . enoxaparin (LOVENOX) injection  40 mg Subcutaneous Q24H  . gabapentin  300 mg Oral BID  . levofloxacin  750 mg Oral Daily  . pravastatin  20 mg Oral QHS  . predniSONE  50 mg Oral Q breakfast  . spironolactone  12.5 mg Oral BID  . tamsulosin  0.4 mg Oral Daily  . tiotropium  18 mcg Inhalation Daily    ROS: History obtained from the patient  General ROS: negative for - chills, fatigue, fever, night sweats, weight gain or weight loss Psychological ROS: negative for - behavioral disorder, hallucinations, memory difficulties, mood swings or suicidal ideation Ophthalmic ROS: negative for - blurry vision, double vision, eye pain or loss of vision ENT ROS: negative for - epistaxis, nasal discharge, oral lesions, sore throat, tinnitus or vertigo Allergy and Immunology ROS: negative for - hives or itchy/watery eyes Hematological and Lymphatic ROS: negative for - bleeding problems, bruising or swollen lymph nodes Endocrine ROS: negative for - galactorrhea, hair pattern changes, polydipsia/polyuria or temperature intolerance Respiratory ROS: negative for - cough, hemoptysis, shortness of breath or wheezing Cardiovascular ROS: negative for - chest pain, dyspnea on exertion, edema or irregular heartbeat Gastrointestinal ROS: negative for - abdominal pain, diarrhea, hematemesis, nausea/vomiting or stool incontinence Genito-Urinary ROS: negative for - dysuria, hematuria, incontinence or urinary frequency/urgency Musculoskeletal ROS: negative for - joint swelling or muscular weakness Neurological ROS: as noted in HPI Dermatological ROS: negative for rash and skin lesion changes  Physical Examination: Blood pressure 95/60, pulse 82, temperature 97.5 F (36.4 C), temperature source Oral, resp. rate 18, height  (1.803 m), weight 249 lb 12.8 oz (113.309 kg), SpO2 100 %.  HEENT-  Normocephalic, no lesions, without obvious  abnormality.  Normal external eye and conjunctiva.  Normal TM's bilaterally.  Normal auditory canals and external ears. Normal external nose, mucus membranes and septum.  Normal pharynx. Cardiovascular- S1, S2 normal, pulses palpable throughout   Lungs- chest clear, no wheezing, rales, normal symmetric air entry Abdomen- soft, non-tender; bowel sounds normal; no masses,  no organomegaly Extremities- mild lower extremity edema, left greater than right Lymph-no adenopathy palpable Musculoskeletal-no joint tenderness, deformity or swelling Skin-warm and dry, no hyperpigmentation, vitiligo, or suspicious lesions  Neurological Examination Mental Status: Alert, oriented, thought content appropriate.  Speech fluent without evidence of aphasia.  Able to follow 3 step commands without difficulty. Cranial Nerves: II: Discs flat bilaterally; Visual fields grossly normal, pupils equal, round, reactive to light and accommodation III,IV, VI: ptosis not present, extra-ocular motions intact bilaterally V,VII: smile symmetric, facial light touch sensation normal bilaterally VIII: hearing normal bilaterally IX,X: gag reflex present XI: bilateral shoulder shrug XII: midline tongue extension Motor: Right : Upper extremity   5/5  Left:     Upper extremity   5/5 5/5 in the RLE.  Left Hip flexor strength 4/5, quad 3/5, hamstrings 3+/5, 0/5 plantar flexion/extension/eversion, 1/5 inversion. Atrophy noted in the hand intrinsics and in the LLE throughout Sensory: Pinprick and light touch decreased on the sole of the LLE Deep Tendon Reflexes: 2+ and symmetric with absent AJ's bilaterally Plantars: Right: mute   Left: mute Cerebellar: Normal finger-to-nose and normal heel-to-shin testing on the right.  Unable to perform on the LLE due to weakness Gait: not tested due to safety concerns.     Laboratory Studies:   Basic Metabolic Panel:  Recent Labs Lab 06/11/15 1820 06/12/15 0726 06/13/15 0640  06/14/15 0432 06/15/15 0424 06/16/15 0415  NA 137  --  139 136 137 135  K 3.8  --  4.3 4.4 4.3 4.3  CL 102  --  102 102 102 103  CO2 26  --  30 28 29 28   GLUCOSE 98  --  115* 121* 129* 115*  BUN 32*  --  23* 28* 37* 38*  CREATININE 1.94* 1.55* 1.38* 1.34* 1.45* 1.24  CALCIUM 8.3*  --  8.4* 8.1* 8.2* 8.1*    Liver Function Tests:  Recent Labs Lab 06/11/15 2121  AST 25  ALT 29  ALKPHOS 51  BILITOT 1.3*  PROT 7.0  ALBUMIN 2.4*   No results for input(s): LIPASE, AMYLASE in the last 168 hours. No results for input(s): AMMONIA in the last 168 hours.  CBC:  Recent Labs Lab 06/11/15 1820 06/11/15 2121 06/12/15 0726 06/13/15 0814  WBC 15.0* 20.3* 12.8* 7.8  NEUTROABS  --  16.2*  --   --   HGB 12.2* 12.2* 11.9* 12.4*  HCT 39.2* 38.7* 37.2* 38.8*  MCV 90.5 89.8 90.4 89.8  PLT 411 357 351 361    Cardiac Enzymes:  Recent Labs Lab 06/11/15 1820 06/11/15 2121  TROPONINI 0.03 0.06*    BNP: Invalid input(s): POCBNP  CBG:  Recent Labs Lab 06/12/15 0640  GLUCAP 105*    Microbiology: Results for orders placed or performed during the hospital encounter of 06/11/15  Urine culture     Status: None   Collection Time: 06/11/15  9:21 PM  Result Value Ref Range Status   Specimen Description URINE, RANDOM  Final   Special Requests Normal  Final   Culture   Final    >=100,000 COLONIES/mL PROTEUS MIRABILIS >=100,000 COLONIES/mL CITROBACTER FREUNDII MULTIPLE SPECIES PRESENT, SUGGEST RECOLLECTION    Report Status 06/16/2015 FINAL  Final   Organism ID, Bacteria PROTEUS MIRABILIS  Final   Organism ID, Bacteria CITROBACTER FREUNDII  Final      Susceptibility   Citrobacter freundii - MIC*    CEFAZOLIN >=64 RESISTANT Resistant     CEFTRIAXONE <=1 SENSITIVE Sensitive     CIPROFLOXACIN <=0.25 SENSITIVE Sensitive     GENTAMICIN <=1 SENSITIVE Sensitive     IMIPENEM 1 SENSITIVE Sensitive     NITROFURANTOIN <=16 SENSITIVE Sensitive     TRIMETH/SULFA <=20 SENSITIVE  Sensitive     PIP/TAZO <=4 SENSITIVE Sensitive     * >=100,000 COLONIES/mL CITROBACTER FREUNDII   Proteus mirabilis - MIC*    AMPICILLIN <=2 SENSITIVE Sensitive     CEFAZOLIN 8 SENSITIVE Sensitive     CEFTRIAXONE <=1 SENSITIVE Sensitive     CIPROFLOXACIN <=0.25 SENSITIVE Sensitive     GENTAMICIN <=1 SENSITIVE Sensitive     IMIPENEM 2 SENSITIVE Sensitive     NITROFURANTOIN 128 RESISTANT Resistant     TRIMETH/SULFA <=  20 SENSITIVE Sensitive     AMPICILLIN/SULBACTAM <=2 SENSITIVE Sensitive     PIP/TAZO <=4 SENSITIVE Sensitive     * >=100,000 COLONIES/mL PROTEUS MIRABILIS  Blood culture (routine x 2)     Status: None (Preliminary result)   Collection Time: 06/12/15 12:57 AM  Result Value Ref Range Status   Specimen Description BLOOD BLOOD RIGHT FOREARM  Final   Special Requests BOTTLES DRAWN AEROBIC AND ANAEROBIC  Final   Culture NO GROWTH 4 DAYS  Final   Report Status PENDING  Incomplete  Blood culture (routine x 2)     Status: None (Preliminary result)   Collection Time: 06/12/15  1:08 AM  Result Value Ref Range Status   Specimen Description BLOOD RIGHT HAND  Final   Special Requests   Final    BOTTLES DRAWN AEROBIC AND ANAEROBIC ,   Culture NO GROWTH 4 DAYS  Final   Report Status PENDING  Incomplete  MRSA PCR Screening     Status: None   Collection Time: 06/12/15  8:17 AM  Result Value Ref Range Status   MRSA by PCR NEGATIVE NEGATIVE Final    Comment:        The GeneXpert MRSA Assay (FDA approved for NASAL specimens only), is one component of a comprehensive MRSA colonization surveillance program. It is not intended to diagnose MRSA infection nor to guide or monitor treatment for MRSA infections.     Coagulation Studies: No results for input(s): LABPROT, INR in the last 72 hours.  Urinalysis:   Recent Labs Lab 06/11/15 2121  COLORURINE AMBER*  LABSPEC 1.014  PHURINE 9.0*  GLUCOSEU NEGATIVE  HGBUR 1+*  BILIRUBINUR NEGATIVE   KETONESUR NEGATIVE  PROTEINUR 100*  NITRITE NEGATIVE  LEUKOCYTESUR 3+*    Lipid Panel:     Component Value Date/Time   CHOL 161 05/18/2014 0353   TRIG 112 05/18/2014 0353   HDL 40 05/18/2014 0353   VLDL 22 05/18/2014 0353   LDLCALC 99 05/18/2014 0353    HgbA1C: No results found for: HGBA1C  Urine Drug Screen:  No results found for: LABOPIA, COCAINSCRNUR, LABBENZ, AMPHETMU, THCU, LABBARB  Alcohol Level: No results for input(s): ETH in the last 168 hours.   Imaging: Mr Brain Wo Contrast  06/14/2015  CLINICAL DATA:  Left leg weakness. Lower extremity swelling. Unable to walk for 3 weeks. Right arm pain and weakness. EXAM: MRI HEAD WITHOUT CONTRAST TECHNIQUE: Multiplanar, multiecho pulse sequences of the brain and surrounding structures were obtained without intravenous contrast. COMPARISON:  None. FINDINGS: There is no evidence of acute infarct, intracranial hemorrhage, mass, midline shift, or extra-axial fluid collection. There is mild cerebral atrophy which is slightly more prominent in the parietal regions. Small foci of T2 hyperintensity scattered throughout the cerebral white matter bilaterally and in the pons are nonspecific but compatible with mild chronic small vessel ischemic disease. Orbits are unremarkable. Paranasal sinuses and mastoid air cells are clear. Major intracranial vascular flow voids are preserved. IMPRESSION: 1. No acute intracranial abnormality. 2. Mild chronic small vessel ischemic disease and cerebral atrophy. Electronically Signed   By: Sebastian Ache M.D.   On: 06/14/2015 14:36   Mr Lumbar Spine Wo Contrast  06/15/2015  CLINICAL DATA:  Progressive weakness in the left lower extremity. EXAM: MRI LUMBAR SPINE WITHOUT CONTRAST TECHNIQUE: Multiplanar, multisequence MR imaging of the lumbar spine was performed. No intravenous contrast was administered. COMPARISON:  06/12/2015 FINDINGS: The lowest lumbar type non-rib-bearing vertebra is labeled as L5. The conus  medullaris appears  normal. Conus level: A L1. In scarring in the left mid kidney and left kidney lower pole. Intervertebral disc desiccation is observed at all levels between L1 and S1. No vertebral subluxation is observed. Additional findings at individual levels are as follows: T12-L1: Unremarkable. L1-2: Mild displacement of the left L1 nerve in the lateral extraforaminal space due to lateral extraforaminal disc protrusion superimposed on disc bulge. Mild left subarticular lateral recess stenosis. Small bilateral facet joint effusions. L2-3:  No impingement.  Disc bulge. L3-4: Mild displacement of both L2 nerves in the lateral extraforaminal space along with borderline bilateral subarticular lateral recess stenosis and borderline bilateral foraminal stenosis due to disc bulge and facet arthropathy in addition to intervertebral spurring. L4-5: Mild right and borderline left foraminal stenosis with mild bilateral subarticular lateral recess stenosis due to disc bulge and facet arthropathy. Small bilateral facet joint effusions. L5-S1: Borderline left subarticular lateral recess stenosis due to disc bulge and left greater than right facet arthropathy. IMPRESSION: 1. Lumbar spondylosis and degenerative disc disease, causing mild impingement at L1- 2, L3-4, and L4-5, as detailed above. 2. No significant clumping of nerve roots to suggest arachnoiditis. 3. Scarring in the left kidney. Electronically Signed   By: Gaylyn Rong M.D.   On: 06/15/2015 17:14   Dg Foot Complete Left  06/14/2015  CLINICAL DATA:  Left foot pain.  No reported injury. EXAM: LEFT FOOT - COMPLETE 3+ VIEW COMPARISON:  None. FINDINGS: No fracture, dislocation or suspicious focal osseous lesion. Lisfranc joint appears intact. There focal cortical erosions at the medial aspect of the talus on the oblique view, with surrounding soft tissue swelling. No pathologic soft tissue calcifications. 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 These results will be called to the ordering clinician or representative by the Radiologist Assistant, and communication documented in the PACS or zVision Dashboard. Electronically Signed   By: Delbert Phenix M.D.   On: 06/14/2015 18:20     Assessment/Plan: 65 year old male with gradually progressive weakness of the LLE that encompasses all muscle groups.  No significant sensory findings.  This is beyond what would be expected from a peroneal palsy.  MRI of the brain personally reviewed and shows no acute changes or abnormalities to explain weakness.  Lumbar spine should be investigated.  Unfortunately from the patient's description he has been unable to move the left foot for at least 3 weeks which makes the prognosis poor for him getting the function back but possibly further damage may be able to averted.    MRI L spine does not explain current symptoms which are chronic Agree with d/c  Out pt EMG/NCS as out pt.    Pauletta Browns  06/16/2015, 1:30 PM

## 2015-06-16 NOTE — Progress Notes (Signed)
MD Cherlynn Kaiser was made aware of pt voiding. Bladder scan showed 46 ml. D/c to [facility].

## 2015-06-17 LAB — CULTURE, BLOOD (ROUTINE X 2)
CULTURE: NO GROWTH
Culture: NO GROWTH

## 2015-06-27 ENCOUNTER — Encounter: Payer: Self-pay | Admitting: Family

## 2015-06-27 ENCOUNTER — Ambulatory Visit: Payer: Medicare Other | Attending: Family | Admitting: Family

## 2015-06-27 VITALS — BP 96/53 | HR 90 | Resp 20 | Ht 71.0 in | Wt 209.0 lb

## 2015-06-27 DIAGNOSIS — Z7982 Long term (current) use of aspirin: Secondary | ICD-10-CM | POA: Diagnosis not present

## 2015-06-27 DIAGNOSIS — I952 Hypotension due to drugs: Secondary | ICD-10-CM

## 2015-06-27 DIAGNOSIS — I959 Hypotension, unspecified: Secondary | ICD-10-CM | POA: Insufficient documentation

## 2015-06-27 DIAGNOSIS — Z9889 Other specified postprocedural states: Secondary | ICD-10-CM | POA: Insufficient documentation

## 2015-06-27 DIAGNOSIS — I10 Essential (primary) hypertension: Secondary | ICD-10-CM | POA: Insufficient documentation

## 2015-06-27 DIAGNOSIS — I4891 Unspecified atrial fibrillation: Secondary | ICD-10-CM | POA: Diagnosis not present

## 2015-06-27 DIAGNOSIS — Z79899 Other long term (current) drug therapy: Secondary | ICD-10-CM | POA: Diagnosis not present

## 2015-06-27 DIAGNOSIS — R002 Palpitations: Secondary | ICD-10-CM | POA: Diagnosis not present

## 2015-06-27 DIAGNOSIS — Z87891 Personal history of nicotine dependence: Secondary | ICD-10-CM | POA: Insufficient documentation

## 2015-06-27 DIAGNOSIS — I482 Chronic atrial fibrillation, unspecified: Secondary | ICD-10-CM

## 2015-06-27 DIAGNOSIS — J449 Chronic obstructive pulmonary disease, unspecified: Secondary | ICD-10-CM | POA: Diagnosis not present

## 2015-06-27 DIAGNOSIS — R42 Dizziness and giddiness: Secondary | ICD-10-CM | POA: Diagnosis not present

## 2015-06-27 DIAGNOSIS — I5022 Chronic systolic (congestive) heart failure: Secondary | ICD-10-CM | POA: Diagnosis not present

## 2015-06-27 DIAGNOSIS — J4489 Other specified chronic obstructive pulmonary disease: Secondary | ICD-10-CM | POA: Insufficient documentation

## 2015-06-27 DIAGNOSIS — Z880 Allergy status to penicillin: Secondary | ICD-10-CM | POA: Insufficient documentation

## 2015-06-27 DIAGNOSIS — M109 Gout, unspecified: Secondary | ICD-10-CM | POA: Insufficient documentation

## 2015-06-27 LAB — BASIC METABOLIC PANEL
ANION GAP: 8 (ref 5–15)
BUN: 36 mg/dL — ABNORMAL HIGH (ref 6–20)
CALCIUM: 9.1 mg/dL (ref 8.9–10.3)
CHLORIDE: 102 mmol/L (ref 101–111)
CO2: 33 mmol/L — AB (ref 22–32)
CREATININE: 1.35 mg/dL — AB (ref 0.61–1.24)
GFR calc Af Amer: >60 mL/min (ref >60)
GFR calc non Af Amer: 53 mL/min — ABNORMAL LOW (ref >60)
GLUCOSE: 85 mg/dL (ref 65–99)
Potassium: 4.5 mmol/L (ref 3.5–5.1)
Sodium: 143 mmol/L (ref 135–145)

## 2015-06-27 MED ORDER — SACUBITRIL-VALSARTAN 24-26 MG PO TABS: 1.0000 | ORAL_TABLET | Freq: Two times a day (BID) | ORAL | Status: AC

## 2015-06-27 NOTE — Patient Instructions (Addendum)
Continue weighing daily and call for an overnight weight gain of > 2 pounds or a weekly weight gain of >5 pounds.  Stop Lisinopril today (06/27/15). Begin Entresto 24/26mg  the evening of 06/28/15 and then twice daily the morning of 06/29/15.

## 2015-06-27 NOTE — Progress Notes (Signed)
Subjective:    Patient ID: George Hopkins, male    DOB: 07-Dec-1950, 65 y.o.   MRN: [id number]  Congestive Heart Failure Presents for initial visit. The disease course has been stable. Associated symptoms include edema (left foot), palpitations and shortness of breath. Pertinent negatives include no abdominal pain, chest pain, chest pressure, fatigue or orthopnea. The symptoms have been stable. Past treatments include ACE inhibitors, beta blockers, oxygen and salt and fluid restriction. The treatment provided moderate relief. Compliance with prior treatments has been good. His past medical history is significant for arrhythmia, chronic lung disease and HTN. There is no history of CVA or DM.  Palpitations  This is a recurrent problem. The current episode started more than 1 year ago. The problem occurs daily. The problem has been unchanged. Nothing aggravates the symptoms. Associated symptoms include anxiety and shortness of breath. Pertinent negatives include no chest fullness, chest pain, coughing, dizziness, malaise/fatigue, nausea or weakness. He has tried beta blockers for the symptoms. The treatment provided mild relief. Risk factors include being male and smoking/tobacco exposure.    Past Medical History  Diagnosis Date  . Hypertension   . CHF (congestive heart failure) (HCC)   . COPD (chronic obstructive pulmonary disease) (HCC)   . Diverticulitis   . Irregular heart beat   . Gout   . Arrhythmia     Past Surgical History  Procedure Laterality Date  . Cardiac surgery    . Cardiac catheterization      Family History  Problem Relation Age of Onset  . Dementia Mother     Social History  Substance Use Topics  . Smoking status: Former Smoker -- 0.50 packs/day for 20 years    Types: Cigarettes    Quit date: 06/26/2005  . Smokeless tobacco: Never Used  . Alcohol Use: No    Allergies  Allergen Reactions  . Penicillins Hives, Swelling and Other (See Comments)    Has  patient had a PCN reaction causing immediate rash, facial/tongue/throat swelling, SOB or lightheadedness with hypotension: Yes Has patient had a PCN reaction causing severe rash involving mucus membranes or skin necrosis: No Has patient had a PCN reaction that required hospitalization No Has patient had a PCN reaction occurring within the last 10 years: No If all of the above answers are "NO", then may proceed with Cephalosporin use.    Prior to Admission medications   Medication Sig Start Date End Date Taking? Authorizing Provider  allopurinol (ZYLOPRIM) 300 MG tablet Take 300 mg by mouth daily.   Yes Historical Provider, MD  aspirin EC 81 MG EC tablet Take 1 tablet (81 mg total) by mouth daily. 06/16/15  Yes Houston Siren, MD  carvedilol (COREG) 3.125 MG tablet Take 1 tablet (3.125 mg total) by mouth 2 (two) times daily. 06/16/15  Yes Houston Siren, MD  colchicine (COLCRYS) 0.6 MG tablet Take 0.6 mg by mouth daily.   Yes Historical Provider, MD  furosemide (LASIX) 80 MG tablet Take 0.5 tablets (40 mg total) by mouth 2 (two) times daily. 06/16/15 06/15/16 Yes Houston Siren, MD  gabapentin (NEURONTIN) 300 MG capsule Take 1 capsule (300 mg total) by mouth 2 (two) times daily. 09/08/14  Yes Alford Highland, MD  HYDROcodone-acetaminophen (NORCO) 5-325 MG tablet Take 1-2 tablets by mouth every 4 (four) hours as needed for moderate pain. 06/16/15  Yes Houston Siren, MD  Ipratropium-Albuterol (COMBIVENT) 20-100 MCG/ACT AERS respimat Inhale 1 puff into the lungs every 6 (six) hours as needed  for wheezing or shortness of breath.    Yes Historical Provider, MD  Melatonin 3 MG TABS Take 3 mg by mouth at bedtime as needed (for sleep).   Yes Historical Provider, MD  oxyCODONE-acetaminophen (PERCOCET/ROXICET) 5-325 MG tablet Take 1 tablet by mouth every 6 (six) hours as needed for severe pain. 06/16/15  Yes Houston Siren, MD  pravastatin (PRAVACHOL) 20 MG tablet Take 20 mg by mouth at bedtime.    Yes  Historical Provider, MD  spironolactone (ALDACTONE) 25 MG tablet Take 12.5 mg by mouth 2 (two) times daily.   Yes Historical Provider, MD  tamsulosin (FLOMAX) 0.4 MG CAPS capsule Take 1 capsule (0.4 mg total) by mouth daily. 06/16/15  Yes Houston Siren, MD  tiotropium (SPIRIVA) 18 MCG inhalation capsule Place 18 mcg into inhaler and inhale daily.   Yes Historical Provider, MD  torsemide (DEMADEX) 20 MG tablet Take 20 mg by mouth daily.   Yes Historical Provider, MD  sacubitril-valsartan (ENTRESTO) 24-26 MG Take 1 tablet by mouth 2 (two) times daily. 06/27/15   Delma Freeze, FNP     Review of Systems  Constitutional: Negative for malaise/fatigue, appetite change and fatigue.  HENT: Positive for postnasal drip. Negative for congestion and sore throat.   Eyes: Negative.   Respiratory: Positive for shortness of breath. Negative for cough, chest tightness and wheezing.   Cardiovascular: Positive for palpitations and leg swelling (left foot). Negative for chest pain.  Gastrointestinal: Negative for nausea, abdominal pain and abdominal distention.  Endocrine: Negative.   Genitourinary: Negative.   Musculoskeletal: Negative for back pain. Arthralgias: left foot.  Skin: Negative.   Allergic/Immunologic: Negative.   Neurological: Positive for light-headedness (when bending over at the waist) and headaches (at times). Negative for dizziness and weakness.  Hematological: Negative for adenopathy. Does not bruise/bleed easily.  Psychiatric/Behavioral: Positive for sleep disturbance ("never a good sleeper": sleeps on 1 pillow with oxgyen at 3L) and dysphoric mood (due to foot). The patient is nervous/anxious.        Objective:   Physical Exam  Constitutional: He is oriented to person, place, and time. He appears well-developed and well-nourished.  HENT:  Head: Normocephalic and atraumatic.  Eyes: Conjunctivae are normal. Pupils are equal, round, and reactive to light.  Neck: Normal range of  motion. Neck supple.  Cardiovascular: An irregular rhythm present. Tachycardia present.   Pulmonary/Chest: Effort normal. He has no wheezes. He has no rales.  Abdominal: Soft. He exhibits no distension. There is no tenderness.  Musculoskeletal: He exhibits edema (1+ pitting edema in left lower leg) and tenderness (left lower leg to palpation).  Neurological: He is alert and oriented to person, place, and time.  Skin: Skin is warm and dry.  Psychiatric: He has a normal mood and affect. His behavior is normal. Thought content normal.  Nursing note and vitals reviewed.   BP 96/53 mmHg  Pulse 90  Resp 20  Ht  (1.803 m)  Wt 209 lb (94.802 kg)  BMI 29.16 kg/m2  SpO2 100%       Assessment & Plan:  1: Chronic heart failure with reduced ejection fraction- Patient presents with shortness of breath upon exertion (Class II). When he does experience shortness of breath, he will stop what he's doing to rest until his breathing improves. Continues to wear his oxygen at 3L around the clock which he says helps quite a bit. He is currently getting weighed daily at [facility]. They are to call for an overnight weight  gain of >2 pounds or a weekly weight gain of >5 pounds. He is not adding any salt to his food and says that that food doesn't taste good since he's on a low salt diet. Reviewed the importance of following a low sodium diet and how this can affect his swelling in his leg. Left lower leg has edema and is tender to touch but the right lower leg doesn't have any. He does try to elevate them during the day. Will go ahead and stop his lisinopril and begin entresto 24/26mg  twice daily. Order written to d/c his lisinopril and give one dose of entresto tomorrow evening (36 hour washout period) and then start giving it twice daily on 06/29/15. Checking a basic metabolic panel today since patient is on 2 diuretics. 2: Atrial fibrillation- Patient's heart rate is very irregular. Currently on  carvedilol and aspirin. Will need to make sure patient has follow-up with cardiology. 3: Hypotension- Blood pressure on recheck was 100/60. He says that he only gets lightheaded when he bends over at the waist and then sits up quickly. Encouraged him to change positions slowly.  Return here in 1 month or sooner for any questions/problems before then.

## 2015-07-18 ENCOUNTER — Encounter: Payer: Self-pay | Admitting: Sports Medicine

## 2015-07-21 DEATH — deceased

## 2015-07-25 ENCOUNTER — Ambulatory Visit: Payer: Medicare Other | Admitting: Family

## 2015-07-25 ENCOUNTER — Telehealth: Payer: Self-pay | Admitting: Family

## 2015-07-25 NOTE — Telephone Encounter (Signed)
Patient did not show for his [facility] appointment on 07/25/15. Will attempt to reschedule.

## 2015-08-14 ENCOUNTER — Ambulatory Visit: Payer: Medicare Other | Admitting: Sports Medicine

## 2016-05-11 IMAGING — CR DG KNEE COMPLETE 4+V*R*
4 series · 4 of 4 positions shown · non-contrast
Comparison: Right knee radiographs 01/02/2015

CLINICAL DATA: Atraumatic right knee pain and swelling.

EXAM:
RIGHT KNEE - COMPLETE 4+ VIEW

[knee ap]
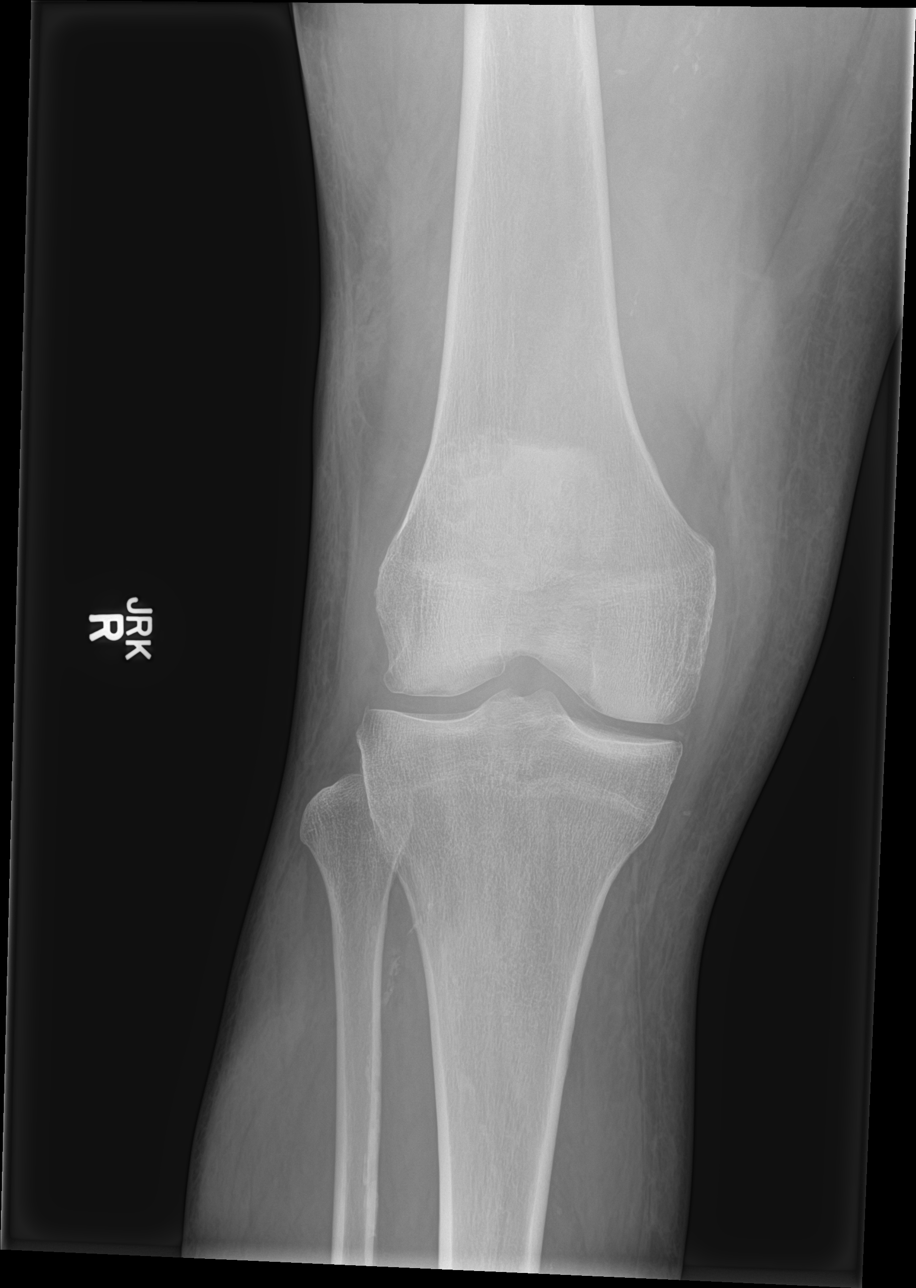

[knee obl (1 of 2)]
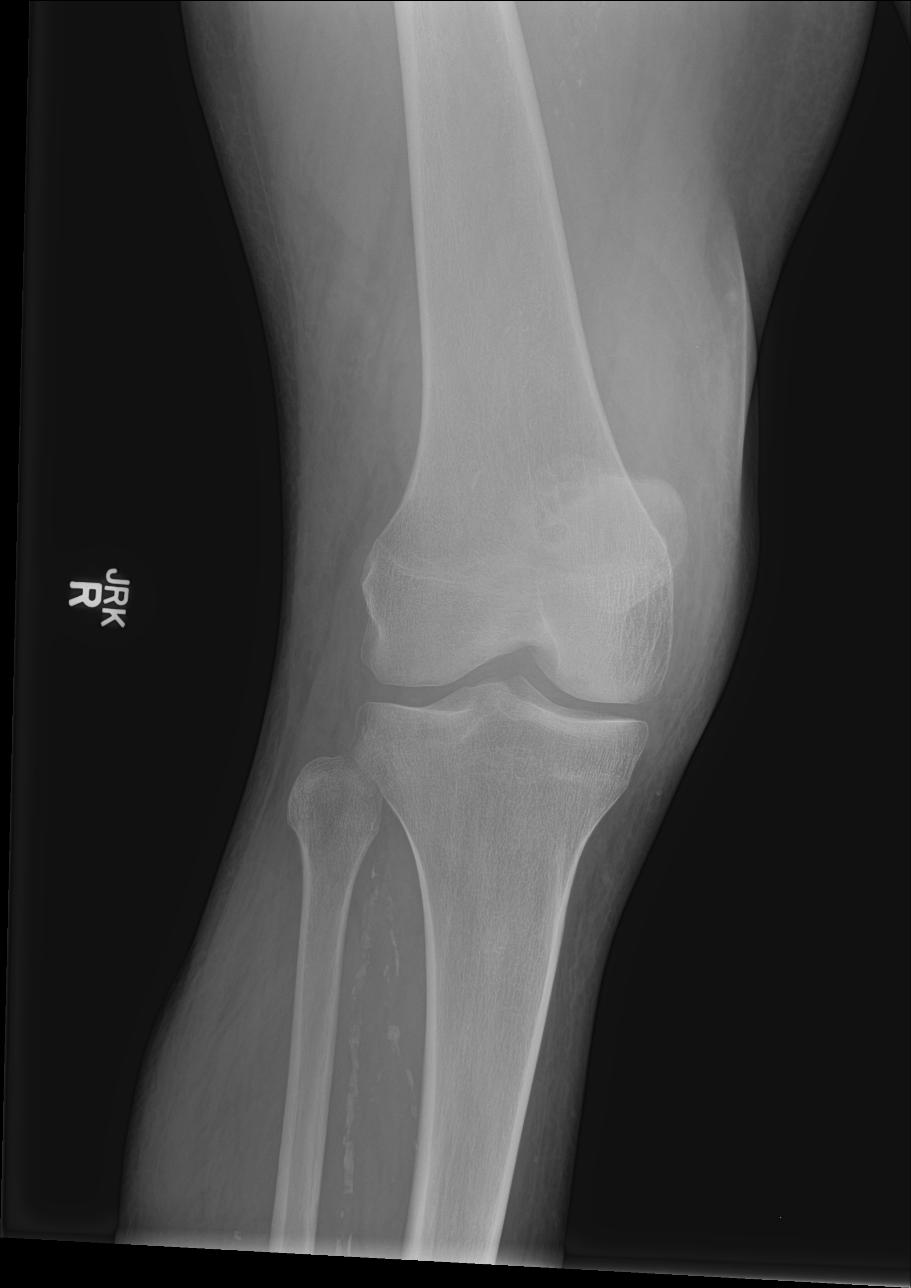

[knee obl (2 of 2)]
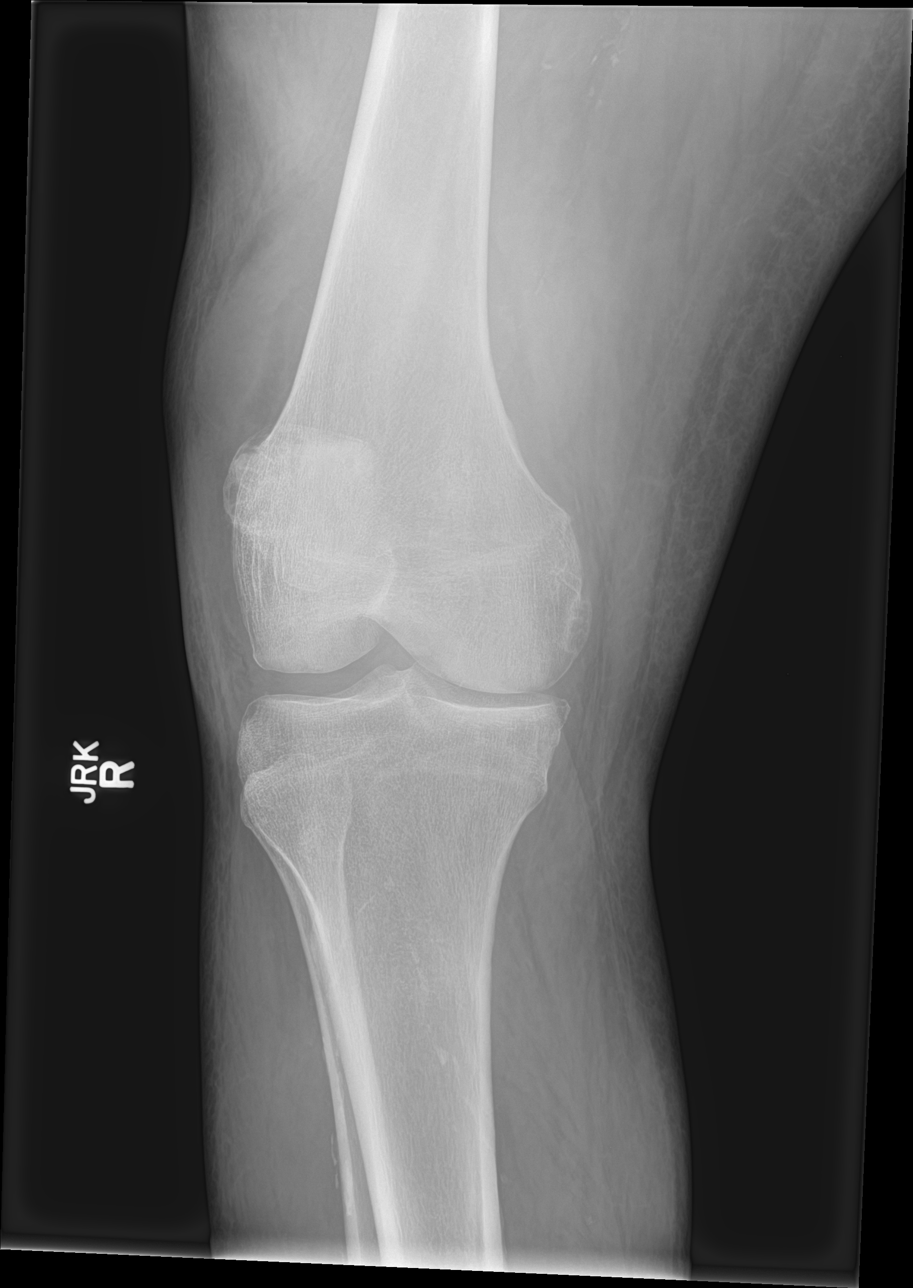

[knee lat]
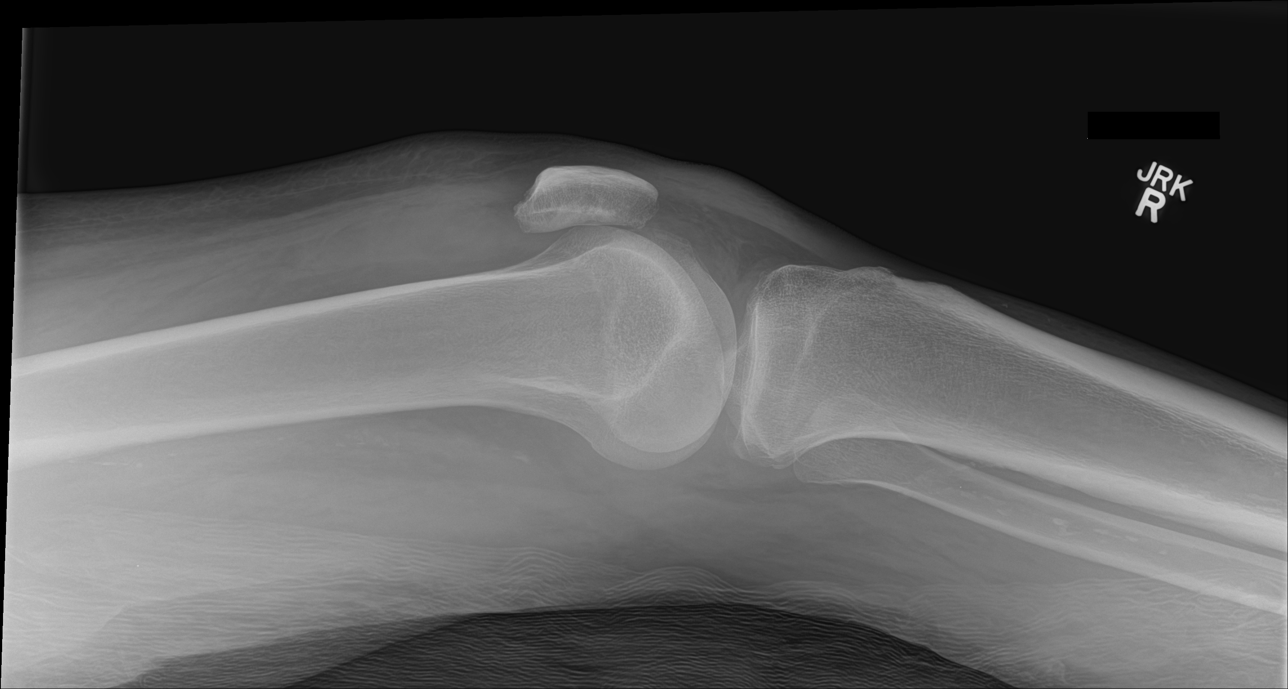

[4 of 4 positions shown; findings below may reference images not displayed]

FINDINGS: Moderate joint effusion, similar prior exam. Mild tricompartmental
osteoarthritis, most significant in the patellofemoral compartment
with subchondral change, stable from prior. [REDACTED] be minimal
chondrocalcinosis in the medial tibial femoral compartment. No bony
destructive change. Atherosclerotic calcifications. Diffuse soft
tissue edema.
IMPRESSION: Moderate joint effusion and diffuse soft tissue edema.

Tricompartmental osteoarthritis, most prominent in the
patellofemoral compartment. No acute bony abnormality.

## 2017-05-14 IMAGING — US US EXTREM LOW VENOUS*R*
1 series · 13 of 24 positions shown · non-contrast
Comparison: None.

CLINICAL DATA: Right knee pain and swelling for 2-3 days



[Series 1: us extrem low venous*right* · 0.08mm/px · 13 of 34 slices shown]
[im 1/34]
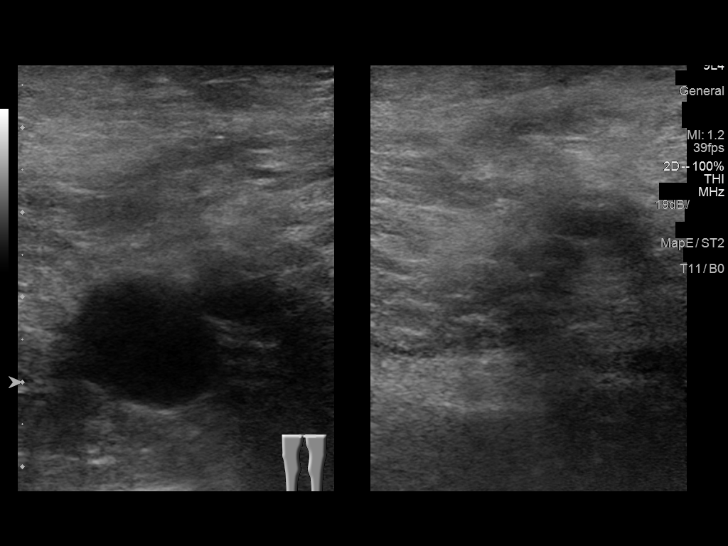
[im 3/34]
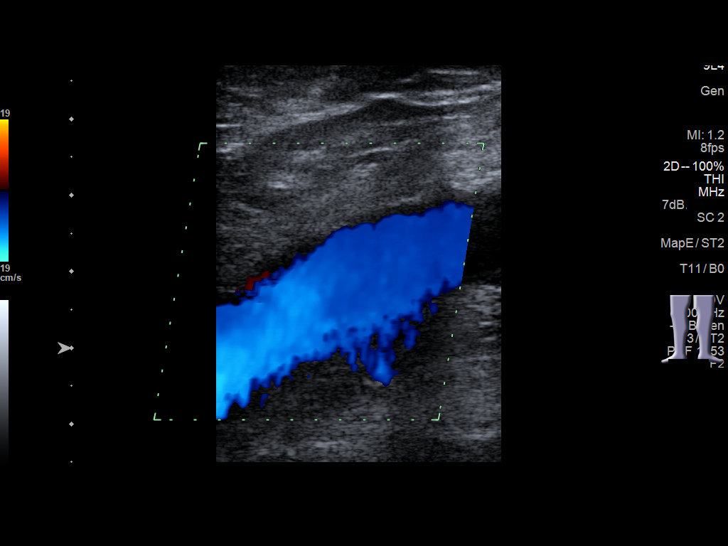
[im 6/34]
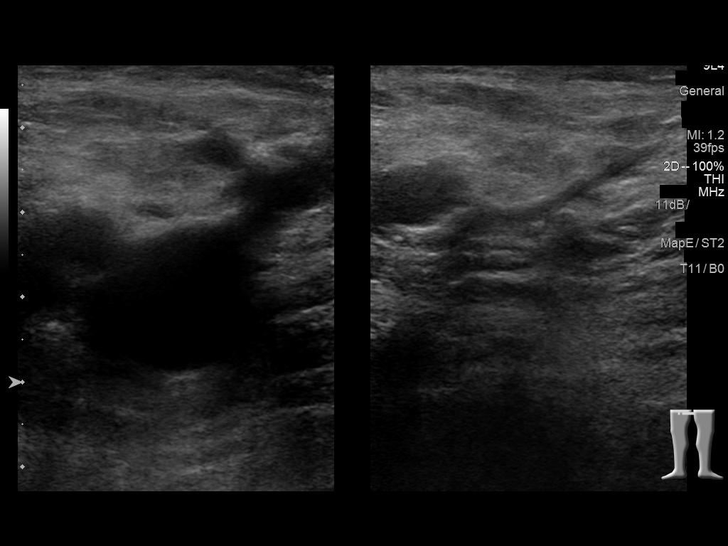
[im 9/34]
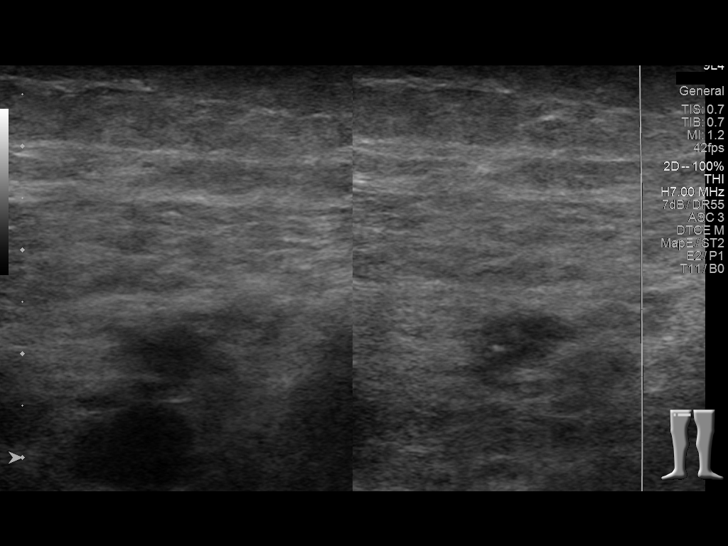
[im 12/34]
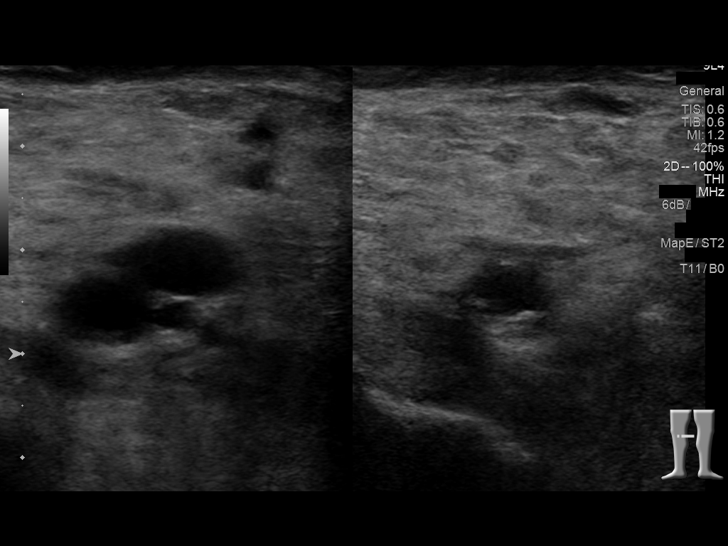
[im 15/34]
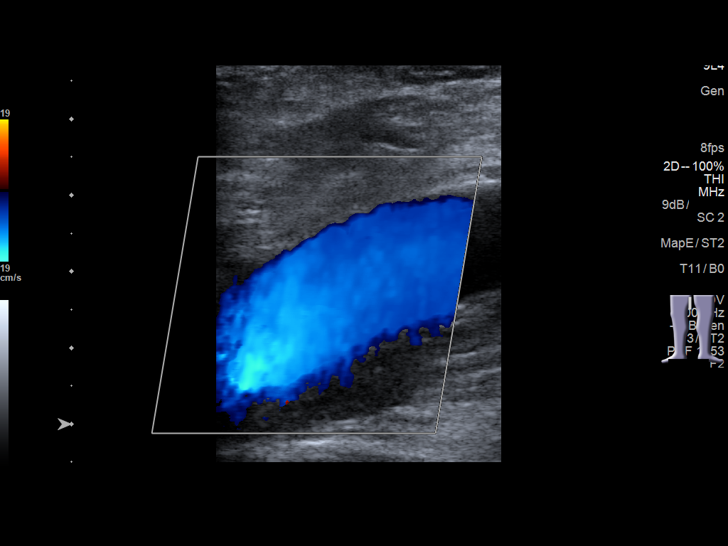
[im 18/34]
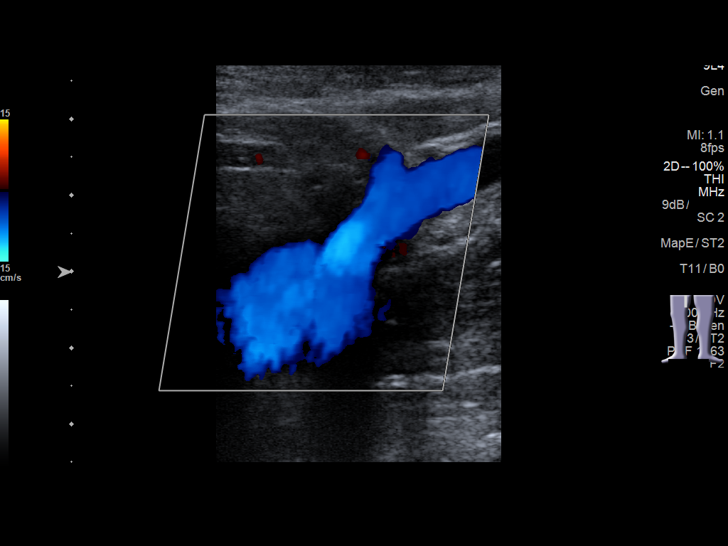
[im 19/34]
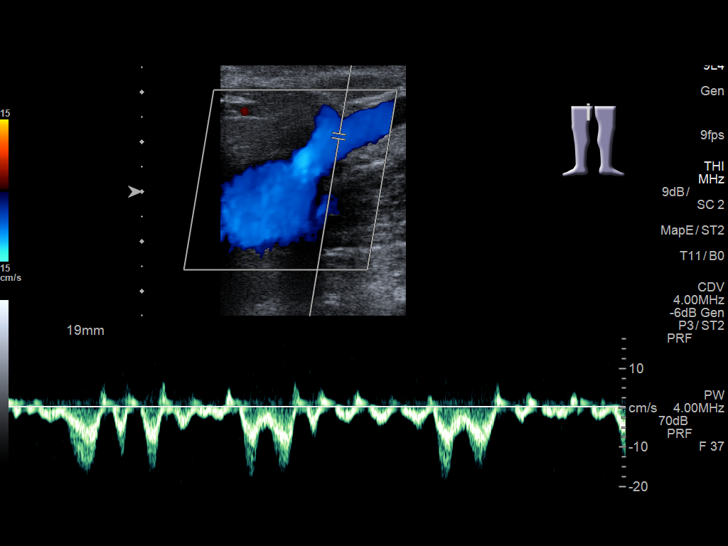
[im 22/34]
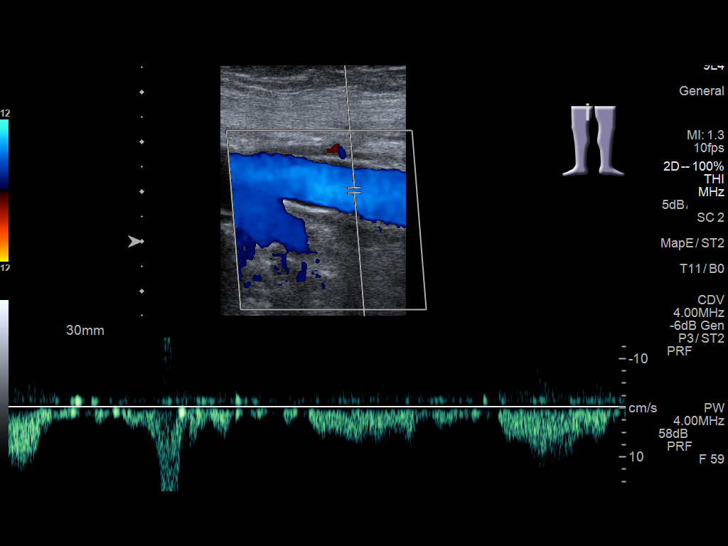
[im 25/34]
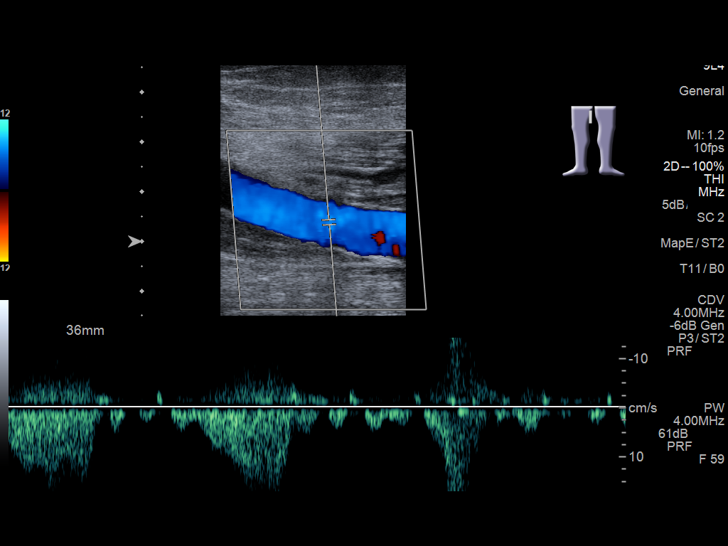
[im 28/34]
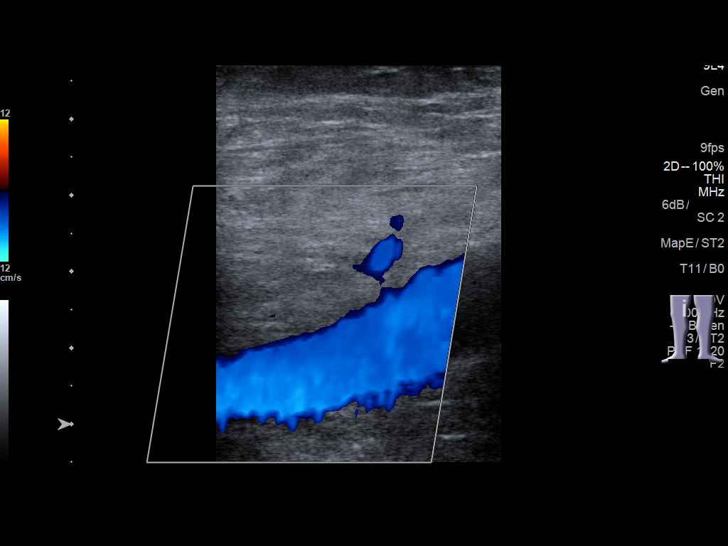
[im 31/34]
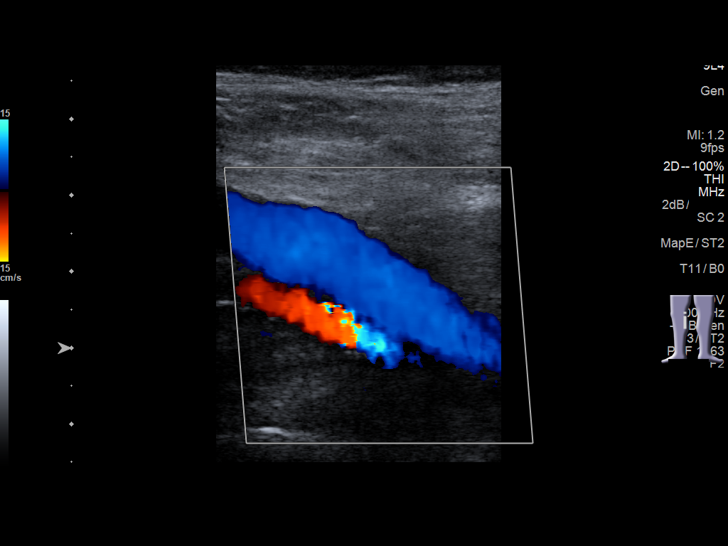
[im 34/34]
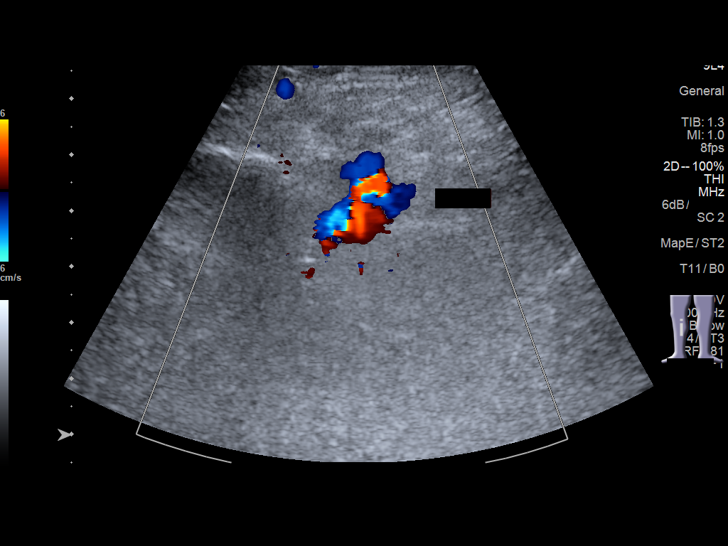

[13 of 24 positions shown; findings below may reference images not displayed]

FINDINGS: Contralateral Common Femoral Vein: Respiratory phasicity is normal
and symmetric with the symptomatic side. No evidence of thrombus.
Normal compressibility.

Common Femoral Vein: No evidence of thrombus.

Saphenofemoral Junction: No evidence of thrombus.

Profunda Femoral Vein: No evidence of thrombus.

Femoral Vein: No evidence of thrombus.

Popliteal Vein: No evidence of thrombus.

Calf Veins (Posterior tibial): No evidence of thrombus.
IMPRESSION: No evidence of right lower extremity DVT.
